# Patient Record
Sex: Male | Born: 1940 | Race: White | Hispanic: No | Marital: Married | Smoking: Never smoker
Health system: Southern US, Community
[De-identification: ages and names within clinical notes are randomized; demographics above are authoritative.]

## PROBLEM LIST (undated history)

## (undated) DIAGNOSIS — M4180 Other forms of scoliosis, site unspecified: Secondary | ICD-10-CM

## (undated) DIAGNOSIS — K7581 Nonalcoholic steatohepatitis (NASH): Secondary | ICD-10-CM

## (undated) DIAGNOSIS — E7800 Pure hypercholesterolemia, unspecified: Secondary | ICD-10-CM

## (undated) DIAGNOSIS — C61 Malignant neoplasm of prostate: Secondary | ICD-10-CM

## (undated) DIAGNOSIS — I4891 Unspecified atrial fibrillation: Secondary | ICD-10-CM

## (undated) HISTORY — PX: HERNIA REPAIR: SHX51

## (undated) HISTORY — PX: OTHER SURGICAL HISTORY: SHX169

## (undated) HISTORY — PX: PACEMAKER IMPLANT: EP1218

## (undated) HISTORY — PX: SHOULDER SURGERY: SHX246

## (undated) HISTORY — PX: KNEE SURGERY: SHX244

---

## 2014-02-12 ENCOUNTER — Encounter (HOSPITAL_BASED_OUTPATIENT_CLINIC_OR_DEPARTMENT_OTHER): Payer: Self-pay | Admitting: Emergency Medicine

## 2014-02-12 ENCOUNTER — Other Ambulatory Visit (HOSPITAL_BASED_OUTPATIENT_CLINIC_OR_DEPARTMENT_OTHER): Payer: Self-pay | Admitting: Family Medicine

## 2014-02-12 ENCOUNTER — Inpatient Hospital Stay (HOSPITAL_BASED_OUTPATIENT_CLINIC_OR_DEPARTMENT_OTHER)
Admission: EM | Admit: 2014-02-12 | Discharge: 2014-02-18 | DRG: 390 | Disposition: A | Discharge status: Home / Self Care | Payer: Medicare Other | Attending: Surgery | Admitting: Surgery

## 2014-02-12 ENCOUNTER — Ambulatory Visit (HOSPITAL_BASED_OUTPATIENT_CLINIC_OR_DEPARTMENT_OTHER)
Admission: RE | Admit: 2014-02-12 | Discharge: 2014-02-12 | Disposition: A | Discharge status: Home / Self Care | Payer: Medicare Other | Source: Ambulatory Visit | Attending: Family Medicine | Admitting: Family Medicine

## 2014-02-12 DIAGNOSIS — K565 Intestinal adhesions [bands], unspecified as to partial versus complete obstruction: Secondary | ICD-10-CM

## 2014-02-12 DIAGNOSIS — M4120 Other idiopathic scoliosis, site unspecified: Secondary | ICD-10-CM | POA: Diagnosis present

## 2014-02-12 DIAGNOSIS — K56609 Unspecified intestinal obstruction, unspecified as to partial versus complete obstruction: Principal | ICD-10-CM | POA: Diagnosis present

## 2014-02-12 DIAGNOSIS — E669 Obesity, unspecified: Secondary | ICD-10-CM | POA: Diagnosis present

## 2014-02-12 DIAGNOSIS — I482 Chronic atrial fibrillation, unspecified: Secondary | ICD-10-CM

## 2014-02-12 DIAGNOSIS — R1031 Right lower quadrant pain: Secondary | ICD-10-CM

## 2014-02-12 DIAGNOSIS — E785 Hyperlipidemia, unspecified: Secondary | ICD-10-CM | POA: Diagnosis present

## 2014-02-12 DIAGNOSIS — R630 Anorexia: Secondary | ICD-10-CM | POA: Diagnosis present

## 2014-02-12 DIAGNOSIS — Z6829 Body mass index (BMI) 29.0-29.9, adult: Secondary | ICD-10-CM

## 2014-02-12 DIAGNOSIS — I4891 Unspecified atrial fibrillation: Secondary | ICD-10-CM | POA: Diagnosis present

## 2014-02-12 DIAGNOSIS — K7689 Other specified diseases of liver: Secondary | ICD-10-CM | POA: Diagnosis present

## 2014-02-12 DIAGNOSIS — M4180 Other forms of scoliosis, site unspecified: Secondary | ICD-10-CM

## 2014-02-12 DIAGNOSIS — G4730 Sleep apnea, unspecified: Secondary | ICD-10-CM | POA: Diagnosis present

## 2014-02-12 DIAGNOSIS — K7581 Nonalcoholic steatohepatitis (NASH): Secondary | ICD-10-CM

## 2014-02-12 HISTORY — DX: Other forms of scoliosis, site unspecified: M41.80

## 2014-02-12 HISTORY — DX: Unspecified atrial fibrillation: I48.91

## 2014-02-12 HISTORY — DX: Nonalcoholic steatohepatitis (NASH): K75.81

## 2014-02-12 HISTORY — DX: Pure hypercholesterolemia, unspecified: E78.00

## 2014-02-12 LAB — URINALYSIS, ROUTINE W REFLEX MICROSCOPIC
Bilirubin Urine: NEGATIVE
GLUCOSE, UA: NEGATIVE mg/dL
Hgb urine dipstick: NEGATIVE
Ketones, ur: NEGATIVE mg/dL
Leukocytes, UA: NEGATIVE
Nitrite: NEGATIVE
Protein, ur: NEGATIVE mg/dL
SPECIFIC GRAVITY, URINE: 1.03 (ref 1.005–1.030)
UROBILINOGEN UA: 1 mg/dL (ref 0.0–1.0)
pH: 6.5 (ref 5.0–8.0)

## 2014-02-12 LAB — COMPREHENSIVE METABOLIC PANEL
ALBUMIN: 4.2 g/dL (ref 3.5–5.2)
ALT: 28 U/L (ref 0–53)
ANION GAP: 13 (ref 5–15)
AST: 35 U/L (ref 0–37)
Alkaline Phosphatase: 53 U/L (ref 39–117)
BILIRUBIN TOTAL: 2 mg/dL — AB (ref 0.3–1.2)
BUN: 19 mg/dL (ref 6–23)
CHLORIDE: 99 meq/L (ref 96–112)
CO2: 29 mEq/L (ref 19–32)
Calcium: 10.4 mg/dL (ref 8.4–10.5)
Creatinine, Ser: 0.9 mg/dL (ref 0.50–1.35)
GFR calc Af Amer: >90 mL/min (ref >90)
GFR calc non Af Amer: 84 mL/min — ABNORMAL LOW (ref >90)
Glucose, Bld: 109 mg/dL — ABNORMAL HIGH (ref 70–99)
POTASSIUM: 4.1 meq/L (ref 3.7–5.3)
Sodium: 141 mEq/L (ref 137–147)
TOTAL PROTEIN: 7.9 g/dL (ref 6.0–8.3)

## 2014-02-12 LAB — CBC WITH DIFFERENTIAL/PLATELET
BASOS ABS: 0 10*3/uL (ref 0.0–0.1)
Basophils Relative: 0 % (ref 0–1)
EOS ABS: 0.1 10*3/uL (ref 0.0–0.7)
Eosinophils Relative: 1 % (ref 0–5)
HCT: 47.4 % (ref 39.0–52.0)
HEMOGLOBIN: 16.1 g/dL (ref 13.0–17.0)
Lymphocytes Relative: 15 % (ref 12–46)
Lymphs Abs: 1.5 10*3/uL (ref 0.7–4.0)
MCH: 31.9 pg (ref 26.0–34.0)
MCHC: 34 g/dL (ref 30.0–36.0)
MCV: 94 fL (ref 78.0–100.0)
MONOS PCT: 11 % (ref 3–12)
Monocytes Absolute: 1.1 10*3/uL — ABNORMAL HIGH (ref 0.1–1.0)
NEUTROS ABS: 7.4 10*3/uL (ref 1.7–7.7)
NEUTROS PCT: 73 % (ref 43–77)
Platelets: 180 10*3/uL (ref 150–400)
RBC: 5.04 MIL/uL (ref 4.22–5.81)
RDW: 13.1 % (ref 11.5–15.5)
WBC: 10.1 10*3/uL (ref 4.0–10.5)

## 2014-02-12 LAB — LIPASE, BLOOD: Lipase: 16 U/L (ref 11–59)

## 2014-02-12 MED ORDER — PROMETHAZINE HCL 25 MG/ML IJ SOLN: 6.2500 mg | Freq: Four times a day (QID) | INTRAMUSCULAR | Status: DC | PRN

## 2014-02-12 MED ORDER — MAGIC MOUTHWASH
15.0000 mL | Freq: Four times a day (QID) | ORAL | Status: DC | PRN
  Administered 2014-02-13 – 2014-02-15 (×2): 15 mL via ORAL
  Filled 2014-02-12 (×2): qty 15

## 2014-02-12 MED ORDER — LIP MEDEX EX OINT
1.0000 "application " | TOPICAL_OINTMENT | Freq: Two times a day (BID) | CUTANEOUS | Status: DC
  Administered 2014-02-12 – 2014-02-17 (×9): 1 via TOPICAL
  Filled 2014-02-12: qty 7

## 2014-02-12 MED ORDER — ONDANSETRON HCL 4 MG/2ML IJ SOLN: 4.0000 mg | Freq: Four times a day (QID) | INTRAMUSCULAR | Status: DC | PRN

## 2014-02-12 MED ORDER — KCL IN DEXTROSE-NACL 40-5-0.45 MEQ/L-%-% IV SOLN
INTRAVENOUS | Status: DC
  Administered 2014-02-12: 23:00:00 via INTRAVENOUS
  Filled 2014-02-12 (×2): qty 1000

## 2014-02-12 MED ORDER — PHENOL 1.4 % MT LIQD: 2.0000 | OROMUCOSAL | Status: DC | PRN

## 2014-02-12 MED ORDER — ALUM & MAG HYDROXIDE-SIMETH 200-200-20 MG/5ML PO SUSP: 30.0000 mL | Freq: Four times a day (QID) | ORAL | Status: DC | PRN

## 2014-02-12 MED ORDER — ACETAMINOPHEN 650 MG RE SUPP
650.0000 mg | Freq: Four times a day (QID) | RECTAL | Status: DC | PRN
  Administered 2014-02-14: 650 mg via RECTAL
  Filled 2014-02-12: qty 1

## 2014-02-12 MED ORDER — LACTATED RINGERS IV BOLUS (SEPSIS): 1000.0000 mL | Freq: Three times a day (TID) | INTRAVENOUS | Status: DC | PRN

## 2014-02-12 MED ORDER — METHOCARBAMOL 1000 MG/10ML IJ SOLN: 500.0000 mg | Freq: Four times a day (QID) | INTRAVENOUS | Status: DC | PRN

## 2014-02-12 MED ORDER — ONDANSETRON HCL 4 MG/2ML IJ SOLN
4.0000 mg | Freq: Once | INTRAMUSCULAR | Status: AC
  Administered 2014-02-12: 4 mg via INTRAVENOUS
  Filled 2014-02-12: qty 2

## 2014-02-12 MED ORDER — ONDANSETRON 8 MG/NS 50 ML IVPB: 8.0000 mg | Freq: Four times a day (QID) | INTRAVENOUS | Status: DC | PRN

## 2014-02-12 MED ORDER — BISACODYL 10 MG RE SUPP
10.0000 mg | Freq: Every day | RECTAL | Status: DC
  Administered 2014-02-13 – 2014-02-17 (×5): 10 mg via RECTAL
  Filled 2014-02-12 (×5): qty 1

## 2014-02-12 MED ORDER — ONDANSETRON HCL 4 MG/2ML IJ SOLN
4.0000 mg | Freq: Once | INTRAMUSCULAR | Status: AC
  Administered 2014-02-12: 4 mg via INTRAVENOUS

## 2014-02-12 MED ORDER — HYDROMORPHONE HCL PF 1 MG/ML IJ SOLN
1.0000 mg | Freq: Once | INTRAMUSCULAR | Status: AC
  Administered 2014-02-12: 1 mg via INTRAVENOUS
  Filled 2014-02-12: qty 1

## 2014-02-12 MED ORDER — HYDROMORPHONE HCL PF 1 MG/ML IJ SOLN
0.5000 mg | INTRAMUSCULAR | Status: DC | PRN
  Administered 2014-02-12: 1 mg via INTRAVENOUS
  Administered 2014-02-12: 0.5 mg via INTRAVENOUS
  Filled 2014-02-12 (×2): qty 1

## 2014-02-12 MED ORDER — HEPARIN SODIUM (PORCINE) 5000 UNIT/ML IJ SOLN
5000.0000 [IU] | Freq: Three times a day (TID) | INTRAMUSCULAR | Status: DC
  Administered 2014-02-12 – 2014-02-18 (×17): 5000 [IU] via SUBCUTANEOUS
  Filled 2014-02-12 (×20): qty 1

## 2014-02-12 MED ORDER — METOPROLOL TARTRATE 1 MG/ML IV SOLN
5.0000 mg | Freq: Four times a day (QID) | INTRAVENOUS | Status: DC
  Administered 2014-02-12 – 2014-02-17 (×14): 5 mg via INTRAVENOUS
  Filled 2014-02-12 (×22): qty 5

## 2014-02-12 MED ORDER — DIPHENHYDRAMINE HCL 50 MG/ML IJ SOLN
12.5000 mg | Freq: Four times a day (QID) | INTRAMUSCULAR | Status: DC | PRN
  Administered 2014-02-18: 25 mg via INTRAVENOUS
  Filled 2014-02-12: qty 1

## 2014-02-12 MED ORDER — MENTHOL 3 MG MT LOZG
1.0000 | LOZENGE | OROMUCOSAL | Status: DC | PRN
  Administered 2014-02-12 – 2014-02-17 (×2): 3 mg via ORAL
  Filled 2014-02-12 (×2): qty 9

## 2014-02-12 MED ORDER — LACTATED RINGERS IV BOLUS (SEPSIS)
1000.0000 mL | Freq: Once | INTRAVENOUS | Status: AC
  Administered 2014-02-12: 1000 mL via INTRAVENOUS

## 2014-02-12 MED ORDER — METOPROLOL TARTRATE 1 MG/ML IV SOLN
5.0000 mg | Freq: Four times a day (QID) | INTRAVENOUS | Status: DC | PRN
  Filled 2014-02-12: qty 5

## 2014-02-12 MED ORDER — ONDANSETRON HCL 4 MG/2ML IJ SOLN
INTRAMUSCULAR | Status: AC
  Administered 2014-02-12: 4 mg via INTRAVENOUS
  Filled 2014-02-12: qty 2

## 2014-02-12 MED ORDER — SODIUM CHLORIDE 0.9 % IV SOLN
1000.0000 mL | INTRAVENOUS | Status: DC
  Administered 2014-02-12: 1000 mL via INTRAVENOUS

## 2014-02-12 NOTE — ED Notes (Signed)
[facility]

## 2014-02-12 NOTE — H&P (Signed)
[facility] [place]  [address] [place], [place] [address] Phone: [phone] FAX: [phone]     Sayf Kerner  11-08-1940 [id number]  CARE TEAM:  PCP: Guadlupe Spanish, MD  Outpatient Care Team: Patient Care Team: Guadlupe Spanish, MD as PCP - General (Internal Medicine) Raelene Bott, MD as Consulting Physician (Otolaryngology) Verdia Kuba, NP as Nurse Practitioner (Internal Medicine)  Inpatient Treatment Team: Treatment Team: Attending Provider: Babette Relic, MD; Consulting Physician: Nolon Nations, MD; Physician Assistant: Larene Pickett, PA-C; Registered Nurse: Ashok Pall, RN; Registered Nurse: Dahlia Bailiff, RN  This patient is a 73 y.o.male who presents today for surgical evaluation at the request of Carmin Muskrat, [facility] ED MD.   Reason for evaluation: Small Bowel Obstruction  Elderly male with history of atrial fibrillation and prior abdominal surgery.  Still works part-time.  Rather active.  It comes today with his wife.  Emergency room nurse in room.  Began to have lower abdominal pain & then mainly right lower quadrant starting yesterday.  Had some watery bowel movements yesterday but nothing today.  Increased belching.  No flatus.  Increasing nausea and pain.  Anorexia.  Went to Urgent care Center.  He tends to have MDs in [place] and [place].  Had prior inguinal hernia repairs decades ago.  Had an umbilical hernia repair done a year or 2 ago.  CT scan suspicious for bowel obstruction.  Sent to [place] emergency room.  Surgical consultation requested.  Patient transferred to [facility] so that surgery could see.  Patient claims in walk 20 minutes without difficulty.  Had an episode of atrial fibrillation that was controlled by cardiology a few years ago in [place].  Stable on metoprolol.  No anticoagulation aside from 81 mg aspirin.  Recalls normal colonoscopy 5 years ago.   No personal nor family history of GI/colon cancer, inflammatory bowel disease, irritable bowel syndrome, allergy such as Celiac Sprue, dietary/dairy problems, colitis, ulcers nor gastritis.  No recent sick contacts/gastroenteritis.  No travel outside the country.  No changes in diet.  No dysphagia to solids or liquids.  No significant heartburn or reflux.  No hematochezia, hematemesis, coffee ground emesis.  No evidence of prior gastric/peptic ulceration.    Past Medical History  Diagnosis Date  . Atrial fibrillation   . High cholesterol   . Dextroscoliosis - lumbar 02/12/2014    Past Surgical History  Procedure Laterality Date  . Hernia repair    . Knee surgery Bilateral   . Shoulder surgery Left   . Carpel tunnel Bilateral     History   Social History  . Marital Status: Married    Spouse Name: N/A    Number of Children: N/A  . Years of Education: N/A   Occupational History  . Not on file.   Social History Main Topics  . Smoking status: Never Smoker   . Smokeless tobacco: Not on file  . Alcohol Use: No  . Drug Use: No  . Sexual Activity: Not on file   Other Topics Concern  . Not on file   Social History Narrative  . No narrative on file    History reviewed. No pertinent family history.  Current Facility-Administered Medications  Medication Dose Route Frequency Provider Last Rate Last Dose  . 0.9 %  sodium chloride infusion  1,000 mL Intravenous Continuous Carmin Muskrat, MD   1,000 mL at 02/12/14 1614   Current  Outpatient Prescriptions  Medication Sig Dispense Refill  . aspirin 81 MG chewable tablet Chew 81 mg by mouth daily.      Marland Kitchen atorvastatin (LIPITOR) 10 MG tablet Take 10 mg by mouth daily.      . metoprolol tartrate (LOPRESSOR) 25 MG tablet Take 25 mg by mouth daily.      . TURMERIC CURCUMIN PO Take by mouth.      . Zinc Sulfate (ZINC 15 PO) Take by mouth.         No Known Allergies  ROS: Constitutional:  No fevers, chills, sweats.  Weight  stable Eyes:  No vision changes, No discharge HENT:  No sore throats, nasal drainage Lymph: No neck swelling, No bruising easily Pulmonary:  No cough, productive sputum CV: No orthopnea, PND  Patient walks 30 minutes for about 1 miles without difficulty.  No exertional chest/neck/shoulder/arm pain. GI: No personal nor family history of GI/colon cancer, inflammatory bowel disease, irritable bowel syndrome, allergy such as Celiac Sprue, dietary/dairy problems, colitis, ulcers nor gastritis.  No recent sick contacts/gastroenteritis.  No travel outside the country.  No changes in diet. Renal: No UTIs, No hematuria Genital:  No drainage, bleeding, masses Musculoskeletal: No severe joint pain.  Good ROM major joints Skin:  No sores or lesions.  No rashes Heme/Lymph:  No easy bleeding.  No swollen lymph nodes Neuro: No focal weakness/numbness.  No seizures Psych: No suicidal ideation.  No hallucinations  BP 129/69  Pulse 73  Temp(Src) 98.9 F (37.2 C) (Oral)  Resp 18  SpO2 97%  Physical Exam: General: Pt awake/alert/oriented x4 in no major acute distress Eyes: PERRL, normal EOM. Sclera nonicteric Neuro: CN II-XII intact w/o focal sensory/motor deficits. Lymph: No head/neck/groin lymphadenopathy Psych:  No delerium/psychosis/paranoia HENT: Normocephalic, Mucus membranes dry.  No thrush.  Nasogastric tube and ring narrow.  Narrow small nasogastric tube.  Creamy tan return.  300 mL in canister. Neck: Supple, No tracheal deviation Chest: No pain.  Good respiratory excursion. CV:  Pulses intact.  Regular rhythm Abdomen: Soft, Mod distended.  Mildly tender periumbilical > RLQ.  No incarcerated hernias.  No guarding. Genital:  Normal external male genitalia.  No inguinal hernias. Ext:  SCDs BLE.  No significant edema.  No cyanosis Skin: No petechiae / purpurea.  No major sores Musculoskeletal: No severe joint pain.  Good ROM major joints   Results:   Labs: Results for orders placed during  the hospital encounter of 02/12/14 (from the past 48 hour(s))  CBC WITH DIFFERENTIAL     Status: Abnormal   Collection Time    02/12/14  4:10 PM      Result Value Ref Range   WBC 10.1  4.0 - 10.5 K/uL   RBC 5.04  4.22 - 5.81 MIL/uL   Hemoglobin 16.1  13.0 - 17.0 g/dL   HCT 47.4  39.0 - 52.0 %   MCV 94.0  78.0 - 100.0 fL   MCH 31.9  26.0 - 34.0 pg   MCHC 34.0  30.0 - 36.0 g/dL   RDW 13.1  11.5 - 15.5 %   Platelets 180  150 - 400 K/uL   Neutrophils Relative % 73  43 - 77 %   Neutro Abs 7.4  1.7 - 7.7 K/uL   Lymphocytes Relative 15  12 - 46 %   Lymphs Abs 1.5  0.7 - 4.0 K/uL   Monocytes Relative 11  3 - 12 %   Monocytes Absolute 1.1 (*) 0.1 - 1.0 K/uL  Eosinophils Relative 1  0 - 5 %   Eosinophils Absolute 0.1  0.0 - 0.7 K/uL   Basophils Relative 0  0 - 1 %   Basophils Absolute 0.0  0.0 - 0.1 K/uL  COMPREHENSIVE METABOLIC PANEL     Status: Abnormal   Collection Time    02/12/14  4:10 PM      Result Value Ref Range   Sodium 141  137 - 147 mEq/L   Potassium 4.1  3.7 - 5.3 mEq/L   Chloride 99  96 - 112 mEq/L   CO2 29  19 - 32 mEq/L   Glucose, Bld 109 (*) 70 - 99 mg/dL   BUN 19  6 - 23 mg/dL   Creatinine, Ser 0.90  0.50 - 1.35 mg/dL   Calcium 10.4  8.4 - 10.5 mg/dL   Total Protein 7.9  6.0 - 8.3 g/dL   Albumin 4.2  3.5 - 5.2 g/dL   AST 35  0 - 37 U/L   ALT 28  0 - 53 U/L   Alkaline Phosphatase 53  39 - 117 U/L   Total Bilirubin 2.0 (*) 0.3 - 1.2 mg/dL   GFR calc non Af Amer 84 (*) >90 mL/min   GFR calc Af Amer >90  >90 mL/min   Comment: (NOTE)     The eGFR has been calculated using the CKD EPI equation.     This calculation has not been validated in all clinical situations.     eGFR's persistently <90 mL/min signify possible Chronic Kidney     Disease.   Anion gap 13  5 - 15  LIPASE, BLOOD     Status: None   Collection Time    02/12/14  4:10 PM      Result Value Ref Range   Lipase 16  11 - 59 U/L  URINALYSIS, ROUTINE W REFLEX MICROSCOPIC     Status: None   Collection  Time    02/12/14  4:26 PM      Result Value Ref Range   Color, Urine YELLOW  YELLOW   APPearance CLEAR  CLEAR   Specific Gravity, Urine 1.030  1.005 - 1.030   pH 6.5  5.0 - 8.0   Glucose, UA NEGATIVE  NEGATIVE mg/dL   Hgb urine dipstick NEGATIVE  NEGATIVE   Bilirubin Urine NEGATIVE  NEGATIVE   Ketones, ur NEGATIVE  NEGATIVE mg/dL   Protein, ur NEGATIVE  NEGATIVE mg/dL   Urobilinogen, UA 1.0  0.0 - 1.0 mg/dL   Nitrite NEGATIVE  NEGATIVE   Leukocytes, UA NEGATIVE  NEGATIVE   Comment: MICROSCOPIC NOT DONE ON URINES WITH NEGATIVE PROTEIN, BLOOD, LEUKOCYTES, NITRITE, OR GLUCOSE <1000 mg/dL.    Imaging / Studies: Ct Abdomen Pelvis Wo Contrast  02/12/2014   CLINICAL DATA:  Abdominal pain and decreased appetite  EXAM: CT ABDOMEN AND PELVIS WITHOUT CONTRAST  TECHNIQUE: Multidetector CT imaging of the abdomen and pelvis was performed following the standard protocol without IV contrast. Oral contrast was administered.  COMPARISON:  October 06, 2005  FINDINGS: There is atelectatic change in the right lower lobe. There is a nodular opacity in the medial segment of the right lower lobe measuring 1.6 x 1.5 cm. This opacity was present although incompletely visualized on the previous study.  Liver is prominent, measuring 17.7 cm in length. There is fatty change in the liver. No focal liver lesions are identified on this noncontrast enhanced study. Gallbladder wall is not thickened. There is no biliary duct dilatation.  Spleen, pancreas, and adrenals appear normal.  There is a parapelvic cyst on the right measuring 2.5 x 1.3 cm. There is a 1.8 x 1.8 cm cyst in the anterior mid left kidney. There is a parapelvic cyst on the left measuring 2.5 x 1.8 cm. There is no appreciable hydronephrosis on either side. There is no renal or ureteral calculus on either side. There is a retroaortic left renal vein, an anatomic variant.  In the pelvis, the prostate is diffusely enlarged with multiple calcifications within the  prostate. There is mild urinary bladder wall thickening. No pelvic mass or fluid collection is appreciable. Appendix appears normal.  There is bowel obstruction with a transition zone near the jejunoileal junction. No free air or portal venous air.  There is no ascites, adenopathy, or abscess in the abdomen or pelvis. There is atherosclerotic change in the aorta but no aneurysm. There is degenerative change in the lumbar spine. There are no blastic or lytic bone lesions. There is lumbar dextroscoliosis.  IMPRESSION: Evidence of a degree of bowel obstruction with transition zone near the jejunoileal junction. No free air. No abscess. Appendix appears normal.  Prominent liver with fatty change.  Nodular area in the right lower lobe, felt to be essentially stable compared to prior study from 2007.  Enlarged prostate. Question cystitis given urinary bladder wall thickening.  These results were called by telephone at the time of interpretation on 02/12/2014 at 3:16 pm to Dr. Threasa Alpha , who verbally acknowledged these results.   Electronically Signed   By: Bretta Bang M.D.   On: 02/12/2014 15:16    Medications / Allergies: per chart  Antibiotics: Anti-infectives   None      Assessment  Prestyn Stanco  73 y.o. male       Problem List:  Principal Problem:   SBO (small bowel obstruction) Active Problems:   Steatohepatitis, nonalcoholic   Obesity (BMI 30-39.9)   Small bowel obstruction most likely from adhesions.  Seems to be in proximal/mid jejunum.  An optically distal.  Nontoxic.  Plan:  Admit  IV fluids.  Nasogastric tube decompression.  Nasogastric tube adjusted and flushed and appropriate filter placed.  Working better now.  If it clots, upsize it to an appropriate caliber.  Despite narrow caliber, seems to be working okay.  Follow.  X-ray films the morning.  Hopefully will resolve nonoperatively.  Pathophysiology of bowel obstruction with the most likely etiology of  adhesions from prior surgery discussed.  No evidence of hernia or malignancy.  Normal colonoscopy in past 10 years argues against colonic etiology.  The patient is stable.  There is no evidence of peritonitis, acute abdomen, nor shock.  There is no strong evidence of failure of improvement nor decline with current non-operative management.  There is no need for surgery at the present moment.   We will continue to follow.   -VTE prophylaxis- SCDs, etc  Hypertension.  IV metoprolol for prevention of paroxysmal atrial fibrillation patient is n.p.o.  Hypercholesterolemia.  Hold medications for now   Mobilize as tolerated to help recovery    Ardeth Sportsman, M.D., F.A.C.S. Gastrointestinal and Minimally Invasive Surgery [facility] [address] [place], [place] [address] [phone] Main / Paging   02/12/2014  Note: Portions of this report may have been transcribed using voice recognition software. Every effort was made to ensure accuracy; however, inadvertent computerized transcription errors may be present.   Any transcriptional errors that result from this process  are unintentional.

## 2014-02-12 NOTE — ED Notes (Signed)
Patient was sweating and nauseous after pain medication. Given 4 mg additional zofran and nausea relieved

## 2014-02-12 NOTE — ED Notes (Signed)
Bed: WA08 Expected date:  Expected time:  Means of arrival:  Comments:  [place]

## 2014-02-12 NOTE — ED Provider Notes (Signed)
  Physical Exam  BP 129/69  Pulse 73  Temp(Src) 98.9 F (37.2 C) (Oral)  Resp 18  SpO2 97%  Physical Exam  Nursing note and vitals reviewed. Constitutional: He is oriented to person, place, and time. He appears well-developed and well-nourished. No distress.  HENT:  Head: Normocephalic and atraumatic.  Mouth/Throat: Oropharynx is clear and moist.  NG tube in place in right nare  Eyes: Conjunctivae and EOM are normal. Pupils are equal, round, and reactive to light.  Neck: Normal range of motion. Neck supple.  Cardiovascular: Normal rate, regular rhythm and normal heart sounds.   Pulmonary/Chest: Effort normal and breath sounds normal. No respiratory distress. He has no wheezes.  Abdominal: Soft. Bowel sounds are normal. He exhibits distension. There is no tenderness. There is no rebound and no guarding.  Abdomen appear distended  Musculoskeletal: Normal range of motion.  Neurological: He is alert and oriented to person, place, and time.  Skin: Skin is warm and dry. He is not diaphoretic.  Psychiatric: He has a normal mood and affect.    ED Course  Procedures  MDM Patient received in [facility] after evaluation with CT scan revealed small bowel obstruction. On my evaluation, patient states he is currently pain-free without any nausea. He has an NG tube in place. I have notified general surgery of his arrival to the ED and they will come down to evaluate and admit.  Diagnosis 1.  Small Bowel Obstruction  Garlon HatchetLisa M Isabellarose Kope, PA-C 02/12/14 2056

## 2014-02-12 NOTE — ED Notes (Signed)
Patient states that he started experiencing R lower abd pain yesterday afternoon that eased off and started hurting mid abd afterwards. Went to urgent care and that ordered CT of abd that reported bowel obstruction

## 2014-02-13 ENCOUNTER — Inpatient Hospital Stay (HOSPITAL_COMMUNITY): Payer: Medicare Other

## 2014-02-13 ENCOUNTER — Encounter (HOSPITAL_COMMUNITY): Payer: Self-pay | Admitting: General Practice

## 2014-02-13 LAB — CBC
HCT: 42.9 % (ref 39.0–52.0)
Hemoglobin: 14.4 g/dL (ref 13.0–17.0)
MCH: 31.6 pg (ref 26.0–34.0)
MCHC: 33.6 g/dL (ref 30.0–36.0)
MCV: 94.1 fL (ref 78.0–100.0)
PLATELETS: 161 10*3/uL (ref 150–400)
RBC: 4.56 MIL/uL (ref 4.22–5.81)
RDW: 13 % (ref 11.5–15.5)
WBC: 9.3 10*3/uL (ref 4.0–10.5)

## 2014-02-13 LAB — BASIC METABOLIC PANEL
Anion gap: 12 (ref 5–15)
BUN: 15 mg/dL (ref 6–23)
CALCIUM: 8.6 mg/dL (ref 8.4–10.5)
CO2: 26 meq/L (ref 19–32)
Chloride: 102 mEq/L (ref 96–112)
Creatinine, Ser: 0.77 mg/dL (ref 0.50–1.35)
GFR calc Af Amer: >90 mL/min (ref >90)
GFR, EST NON AFRICAN AMERICAN: 89 mL/min — AB (ref >90)
Glucose, Bld: 138 mg/dL — ABNORMAL HIGH (ref 70–99)
POTASSIUM: 3.9 meq/L (ref 3.7–5.3)
SODIUM: 140 meq/L (ref 137–147)

## 2014-02-13 MED ORDER — CETYLPYRIDINIUM CHLORIDE 0.05 % MT LIQD
7.0000 mL | Freq: Two times a day (BID) | OROMUCOSAL | Status: DC
  Administered 2014-02-13 – 2014-02-14 (×3): 7 mL via OROMUCOSAL

## 2014-02-13 MED ORDER — KCL IN DEXTROSE-NACL 30-5-0.45 MEQ/L-%-% IV SOLN
INTRAVENOUS | Status: DC
  Administered 2014-02-13 – 2014-02-18 (×10): via INTRAVENOUS
  Filled 2014-02-13 (×13): qty 1000

## 2014-02-13 MED ORDER — CHLORHEXIDINE GLUCONATE 0.12 % MT SOLN
15.0000 mL | Freq: Two times a day (BID) | OROMUCOSAL | Status: DC
  Administered 2014-02-13 – 2014-02-15 (×6): 15 mL via OROMUCOSAL
  Filled 2014-02-13 (×8): qty 15

## 2014-02-13 NOTE — Progress Notes (Signed)
Patient ID: Clayton Daniels, male   DOB: 08/12/1940, 73 y.o.   MRN: [id number]  General Surgery - [facility] - Progress Note  HD# 2  Subjective: Patient with mild pain in mid abdomen.  Passed small flatus, no BM.  No nausea, emesis.  NG in place.  Objective: Vital signs in last 24 hours: Temp:  [97.5 F (36.4 C)-99.7 F (37.6 C)] 99.7 F (37.6 C) (08/23 0536) Pulse Rate:  [64-85] 84 (08/23 0536) Resp:  [16-18] 17 (08/23 0536) BP: (92-157)/(51-88) 92/51 mmHg (08/23 0536) SpO2:  [90 %-97 %] 92 % (08/23 0536) Weight:  [220 lb (99.791 kg)] 220 lb (99.791 kg) (08/22 2238)    Intake/Output from previous day: 08/22 0701 - 08/23 0700 In: 1948.3 [I.V.:948.3; IV Piggyback:1000] Out: 445 [Urine:225; Emesis/NG output:220]  Exam: HEENT - clear, not icteric Neck - soft Chest - clear bilaterally Cor - RRR, no murmur Abd - soft without distension; BS present; mild periumbilical tenderness Ext - no significant edema Neuro - grossly intact, no focal deficits  Lab Results:   Recent Labs  02/12/14 1610 02/13/14 0604  WBC 10.1 9.3  HGB 16.1 14.4  HCT 47.4 42.9  PLT 180 161     Recent Labs  02/12/14 1610 02/13/14 0604  NA 141 140  K 4.1 3.9  CL 99 102  CO2 29 26  GLUCOSE 109* 138*  BUN 19 15  CREATININE 0.90 0.77  CALCIUM 10.4 8.6    Studies/Results: Ct Abdomen Pelvis Wo Contrast  02/12/2014   CLINICAL DATA:  Abdominal pain and decreased appetite  EXAM: CT ABDOMEN AND PELVIS WITHOUT CONTRAST  TECHNIQUE: Multidetector CT imaging of the abdomen and pelvis was performed following the standard protocol without IV contrast. Oral contrast was administered.  COMPARISON:  October 06, 2005  FINDINGS: There is atelectatic change in the right lower lobe. There is a nodular opacity in the medial segment of the right lower lobe measuring 1.6 x 1.5 cm. This opacity was present although incompletely visualized on the previous study.  Liver is prominent, measuring 17.7 cm in  length. There is fatty change in the liver. No focal liver lesions are identified on this noncontrast enhanced study. Gallbladder wall is not thickened. There is no biliary duct dilatation.  Spleen, pancreas, and adrenals appear normal.  There is a parapelvic cyst on the right measuring 2.5 x 1.3 cm. There is a 1.8 x 1.8 cm cyst in the anterior mid left kidney. There is a parapelvic cyst on the left measuring 2.5 x 1.8 cm. There is no appreciable hydronephrosis on either side. There is no renal or ureteral calculus on either side. There is a retroaortic left renal vein, an anatomic variant.  In the pelvis, the prostate is diffusely enlarged with multiple calcifications within the prostate. There is mild urinary bladder wall thickening. No pelvic mass or fluid collection is appreciable. Appendix appears normal.  There is bowel obstruction with a transition zone near the jejunoileal junction. No free air or portal venous air.  There is no ascites, adenopathy, or abscess in the abdomen or pelvis. There is atherosclerotic change in the aorta but no aneurysm. There is degenerative change in the lumbar spine. There are no blastic or lytic bone lesions. There is lumbar dextroscoliosis.  IMPRESSION: Evidence of a degree of bowel obstruction with transition zone near the jejunoileal junction. No free air. No abscess. Appendix appears normal.  Prominent liver with fatty change.  Nodular area in the right lower lobe, felt to be  essentially stable compared to prior study from 2007.  Enlarged prostate. Question cystitis given urinary bladder wall thickening.  These results were called by telephone at the time of interpretation on 02/12/2014 at 3:16 pm to Dr. Threasa Alpha , who verbally acknowledged these results.   Electronically Signed   By: Bretta Bang M.D.   On: 02/12/2014 15:16    Assessment / Plan: 1.  Partial small bowel obstruction  AXR reviewed and contrast now in colon  Clinically improving, will leave NG in  place today  IV hydration, NPO except for ice chips  Ambulate in halls  Will follow  Velora Heckler, MD, [facility] Office: [phone]  02/13/2014

## 2014-02-14 DIAGNOSIS — R509 Fever, unspecified: Secondary | ICD-10-CM

## 2014-02-14 NOTE — Progress Notes (Signed)
General surgery attending:  I have interviewed and examined this patient this morning. I agree with the assessment and treatment plan outlined by Mr. Marlyne Beards, [place].  The patient is passing flatus and feels a good deal better. His coughing a little bit but not productive. A low-grade temp, 100.8, not sure why this is.  Abdominal x-rays yesterday showed that contrast admitted through the colon, but there were still some dilated small bowel loops.  On exam he looks good. No distress. Alert. Abdomen is soft but a little bit tender on the right side. Infraumbilical incision well healed no hernia. Groin incision is well healed from prior inguinal hernia surgery, no mass or recurrence.  Assessment/plan: Partial small bowel obstruction. Presumed secondary to adhesions. No sign of recurrent hernia on exam.   Passing flatus and x-ray yesterday looked better. I agree with towel of NG tube clamping Repeat labs and x-rays tomorrow  Low-grade fever. Etiology and significance unclear.  Check urinalysis. Recheck WBC with differential tomorrow.  Hypertension. On IV beta blocker for nail due to paroxysmal atrial fibrillation.  Hyperlipidemia. Restart medications as soon as diet resumed.   Angelia Mould. Derrell Lolling, M.D., [facility] General and Minimally invasive Surgery Breast and Colorectal Surgery Office:   [phone] Pager:   [phone]   VTE prophylaxis. On subcutaneous heparin.

## 2014-02-14 NOTE — Progress Notes (Signed)
Subjective: He feels better, some flatus, he walked some yesterday.  He says it feels like he still bloated with coughing.  He has had 3 hernia surgeries in the past.  Objective: Vital signs in last 24 hours: Temp:  [98.2 F (36.8 C)-100.8 F (38.2 C)] 99.2 F (37.3 C) (08/24 0500) Pulse Rate:  [69-91] 89 (08/24 0500) Resp:  [14-18] 14 (08/24 0500) BP: (107-117)/(58-68) 117/63 mmHg (08/24 0500) SpO2:  [92 %-95 %] 92 % (08/24 0500) Last BM Date: 02/13/14 (loose) NG 250 recorded. Ice chips TM 100.8 early this AM 0200 No labs this AM NO film this AM  Contrast in colon and dilated SB on film yesterday.  Intake/Output from previous day: 08/23 0701 - 08/24 0700 In: 2423.3 [I.V.:2423.3] Out: 2225 [Urine:1975; Emesis/NG output:250] Intake/Output this shift:    General appearance: alert, cooperative and no distress Resp: clear to auscultation bilaterally GI: BS hyperactive, he is not really distended.  A little flatus, no BM.    Lab Results:   Recent Labs  02/12/14 1610 02/13/14 0604  WBC 10.1 9.3  HGB 16.1 14.4  HCT 47.4 42.9  PLT 180 161    BMET  Recent Labs  02/12/14 1610 02/13/14 0604  NA 141 140  K 4.1 3.9  CL 99 102  CO2 29 26  GLUCOSE 109* 138*  BUN 19 15  CREATININE 0.90 0.77  CALCIUM 10.4 8.6   PT/INR No results found for this basename: LABPROT, INR,  in the last 72 hours   Recent Labs Lab 02/12/14 1610  AST 35  ALT 28  ALKPHOS 53  BILITOT 2.0*  PROT 7.9  ALBUMIN 4.2     Lipase     Component Value Date/Time   LIPASE 16 02/12/2014 1610     Studies/Results: Ct Abdomen Pelvis Wo Contrast  02/12/2014   CLINICAL DATA:  Abdominal pain and decreased appetite  EXAM: CT ABDOMEN AND PELVIS WITHOUT CONTRAST  TECHNIQUE: Multidetector CT imaging of the abdomen and pelvis was performed following the standard protocol without IV contrast. Oral contrast was administered.  COMPARISON:  October 06, 2005  FINDINGS: There is atelectatic change in the  right lower lobe. There is a nodular opacity in the medial segment of the right lower lobe measuring 1.6 x 1.5 cm. This opacity was present although incompletely visualized on the previous study.  Liver is prominent, measuring 17.7 cm in length. There is fatty change in the liver. No focal liver lesions are identified on this noncontrast enhanced study. Gallbladder wall is not thickened. There is no biliary duct dilatation.  Spleen, pancreas, and adrenals appear normal.  There is a parapelvic cyst on the right measuring 2.5 x 1.3 cm. There is a 1.8 x 1.8 cm cyst in the anterior mid left kidney. There is a parapelvic cyst on the left measuring 2.5 x 1.8 cm. There is no appreciable hydronephrosis on either side. There is no renal or ureteral calculus on either side. There is a retroaortic left renal vein, an anatomic variant.  In the pelvis, the prostate is diffusely enlarged with multiple calcifications within the prostate. There is mild urinary bladder wall thickening. No pelvic mass or fluid collection is appreciable. Appendix appears normal.  There is bowel obstruction with a transition zone near the jejunoileal junction. No free air or portal venous air.  There is no ascites, adenopathy, or abscess in the abdomen or pelvis. There is atherosclerotic change in the aorta but no aneurysm. There is degenerative change in the lumbar spine. There  are no blastic or lytic bone lesions. There is lumbar dextroscoliosis.  IMPRESSION: Evidence of a degree of bowel obstruction with transition zone near the jejunoileal junction. No free air. No abscess. Appendix appears normal.  Prominent liver with fatty change.  Nodular area in the right lower lobe, felt to be essentially stable compared to prior study from 2007.  Enlarged prostate. Question cystitis given urinary bladder wall thickening.  These results were called by telephone at the time of interpretation on 02/12/2014 at 3:16 pm to Dr. Threasa Alpha , who verbally  acknowledged these results.   Electronically Signed   By: Bretta Bang M.D.   On: 02/12/2014 15:16   Dg Abd Acute W/chest  02/13/2014   CLINICAL DATA:  Abdominal pain.  EXAM: ACUTE ABDOMEN SERIES (ABDOMEN 2 VIEW & CHEST 1 VIEW)  COMPARISON:  CT scan of February 12, 2014.  FINDINGS: Nasogastric tube tip is seen in proximal stomach. Residual contrast is noted throughout the colon. Multilevel degenerative disc disease is noted in the lumbar spine. Dilated small bowel loops are noted in the central portion the abdomen concerning for distal small bowel obstruction. Heart size and mediastinal contours are within normal limits. Both lungs are clear.  IMPRESSION: Dilated small bowel loops are noted concerning for distal small bowel partial obstruction. Residual contrast is noted throughout the colon.   Electronically Signed   By: Roque Lias M.D.   On: 02/13/2014 08:23    Medications: . antiseptic oral rinse  7 mL Mouth Rinse q12n4p  . bisacodyl  10 mg Rectal Daily  . chlorhexidine  15 mL Mouth Rinse BID  . heparin  5,000 Units Subcutaneous 3 times per day  . lip balm  1 application Topical BID  . metoprolol  5 mg Intravenous 4 times per day   Scheduled Meds: . antiseptic oral rinse  7 mL Mouth Rinse q12n4p  . bisacodyl  10 mg Rectal Daily  . chlorhexidine  15 mL Mouth Rinse BID  . heparin  5,000 Units Subcutaneous 3 times per day  . lip balm  1 application Topical BID  . metoprolol  5 mg Intravenous 4 times per day   Continuous Infusions: . dexrose 5 % and 0.45 % NaCl with KCl 30 mEq/L 100 mL/hr at 02/14/14 0257   Prior to Admission medications   Medication Sig Start Date End Date Taking? Authorizing Provider  aspirin 81 MG chewable tablet Chew 81 mg by mouth daily.   Yes Historical Provider, MD  atorvastatin (LIPITOR) 10 MG tablet Take 10 mg by mouth daily.   Yes Historical Provider, MD  metoprolol succinate (TOPROL-XL) 25 MG 24 hr tablet Take 25 mg by mouth daily.   Yes Historical  Provider, MD  Multiple Vitamin (MULTIVITAMIN WITH MINERALS) TABS tablet Take 1 tablet by mouth daily. Men's 50 plus   Yes Historical Provider, MD  naproxen sodium (ANAPROX) 220 MG tablet Take 220 mg by mouth 2 (two) times daily as needed (for pain.).   Yes Historical Provider, MD  TURMERIC CURCUMIN PO Take 1 capsule by mouth 2 (two) times daily.    Yes Historical Provider, MD  white petrolatum (VASELINE) GEL Apply 1 application topically daily. Applied to area of head once daily   Yes Historical Provider, MD  Zinc 50 MG CAPS Take 50 mg by mouth daily.   Yes Historical Provider, MD    Assessment/Plan SBO with hx of umbilical hernia repair. Atrial fibrillation Dextro scoliosis Dyslipidemia  Body mass index is 29.83 kg/(m^2).  PLan:  I am going to try clamping his NG there is 600 ml in the cannister.  He may be better, but he doesn't look like he is  Ready to pull NG and go to clears.  Recheck film and labs in AM.  IS today.  LOS: 2 days    Clayton Daniels 02/14/2014

## 2014-02-15 ENCOUNTER — Inpatient Hospital Stay (HOSPITAL_COMMUNITY): Payer: Medicare Other

## 2014-02-15 LAB — BASIC METABOLIC PANEL
ANION GAP: 12 (ref 5–15)
Anion gap: 13 (ref 5–15)
BUN: 8 mg/dL (ref 6–23)
BUN: 8 mg/dL (ref 6–23)
CO2: 25 meq/L (ref 19–32)
CO2: 26 meq/L (ref 19–32)
CREATININE: 0.83 mg/dL (ref 0.50–1.35)
Calcium: 8.4 mg/dL (ref 8.4–10.5)
Calcium: 8.9 mg/dL (ref 8.4–10.5)
Chloride: 102 mEq/L (ref 96–112)
Chloride: 103 mEq/L (ref 96–112)
Creatinine, Ser: 0.85 mg/dL (ref 0.50–1.35)
GFR calc Af Amer: >90 mL/min (ref >90)
GFR calc Af Amer: >90 mL/min (ref >90)
GFR calc non Af Amer: 86 mL/min — ABNORMAL LOW (ref >90)
GFR calc non Af Amer: 86 mL/min — ABNORMAL LOW (ref >90)
GLUCOSE: 119 mg/dL — AB (ref 70–99)
Glucose, Bld: 124 mg/dL — ABNORMAL HIGH (ref 70–99)
POTASSIUM: 3.5 meq/L — AB (ref 3.7–5.3)
Potassium: 3.9 mEq/L (ref 3.7–5.3)
SODIUM: 142 meq/L (ref 137–147)
Sodium: 139 mEq/L (ref 137–147)

## 2014-02-15 LAB — URINALYSIS, ROUTINE W REFLEX MICROSCOPIC
BILIRUBIN URINE: NEGATIVE
Glucose, UA: NEGATIVE mg/dL
KETONES UR: NEGATIVE mg/dL
Leukocytes, UA: NEGATIVE
Nitrite: NEGATIVE
Protein, ur: NEGATIVE mg/dL
Specific Gravity, Urine: 1.019 (ref 1.005–1.030)
Urobilinogen, UA: 0.2 mg/dL (ref 0.0–1.0)
pH: 6 (ref 5.0–8.0)

## 2014-02-15 LAB — CBC
HEMATOCRIT: 42.6 % (ref 39.0–52.0)
HEMOGLOBIN: 14.6 g/dL (ref 13.0–17.0)
MCH: 32.2 pg (ref 26.0–34.0)
MCHC: 34.3 g/dL (ref 30.0–36.0)
MCV: 94 fL (ref 78.0–100.0)
Platelets: 176 10*3/uL (ref 150–400)
RBC: 4.53 MIL/uL (ref 4.22–5.81)
RDW: 12.9 % (ref 11.5–15.5)
WBC: 10.8 10*3/uL — AB (ref 4.0–10.5)

## 2014-02-15 LAB — URINE MICROSCOPIC-ADD ON

## 2014-02-15 LAB — CBC WITH DIFFERENTIAL/PLATELET
BASOS PCT: 0 % (ref 0–1)
Basophils Absolute: 0 10*3/uL (ref 0.0–0.1)
Eosinophils Absolute: 0.1 10*3/uL (ref 0.0–0.7)
Eosinophils Relative: 1 % (ref 0–5)
HCT: 39.6 % (ref 39.0–52.0)
HEMOGLOBIN: 13.2 g/dL (ref 13.0–17.0)
LYMPHS ABS: 1.2 10*3/uL (ref 0.7–4.0)
Lymphocytes Relative: 12 % (ref 12–46)
MCH: 31.6 pg (ref 26.0–34.0)
MCHC: 33.3 g/dL (ref 30.0–36.0)
MCV: 94.7 fL (ref 78.0–100.0)
MONO ABS: 1.2 10*3/uL — AB (ref 0.1–1.0)
MONOS PCT: 12 % (ref 3–12)
NEUTROS ABS: 7.5 10*3/uL (ref 1.7–7.7)
Neutrophils Relative %: 75 % (ref 43–77)
Platelets: 151 10*3/uL (ref 150–400)
RBC: 4.18 MIL/uL — AB (ref 4.22–5.81)
RDW: 12.9 % (ref 11.5–15.5)
WBC: 9.9 10*3/uL (ref 4.0–10.5)

## 2014-02-15 MED ORDER — ACETAMINOPHEN 650 MG RE SUPP
650.0000 mg | Freq: Four times a day (QID) | RECTAL | Status: DC | PRN
  Administered 2014-02-16: 650 mg via RECTAL
  Filled 2014-02-15 (×2): qty 1

## 2014-02-15 MED ORDER — PANTOPRAZOLE SODIUM 40 MG IV SOLR
40.0000 mg | Freq: Two times a day (BID) | INTRAVENOUS | Status: DC
  Administered 2014-02-15 – 2014-02-16 (×4): 40 mg via INTRAVENOUS
  Filled 2014-02-15 (×6): qty 40

## 2014-02-15 MED ORDER — IOHEXOL 300 MG/ML  SOLN: 50.0000 mL | Freq: Once | INTRAMUSCULAR | Status: AC | PRN

## 2014-02-15 NOTE — Progress Notes (Signed)
NG tube came out, reinsertion attempted by [facility], RN.  PA contacted for orders.  Attempted reinsertion with 18G ngt, unsuccessful x2.  PA contacted, orders for IR insertion.

## 2014-02-15 NOTE — Progress Notes (Signed)
Subjective: He says he feels better, high pitched BS, and he still feels a bit distended.  No nausea.  Ready to get his NG out.  Objective: Vital signs in last 24 hours: Temp:  [98.9 F (37.2 C)-99.8 F (37.7 C)] 99.5 F (37.5 C) (08/25 0554) Pulse Rate:  [77-86] 81 (08/25 0554) Resp:  [16-18] 16 (08/25 0554) BP: (98-127)/(62-79) 127/79 mmHg (08/25 0554) SpO2:  [91 %-98 %] 93 % (08/25 0554) Last BM Date: 02/13/14 (loose) 450 from the NG, 2 BM's recorded Tm 99.8, VSS K+ down some, otherwise normal labs Film shows increased SB dilatation.  I don't see the contrast in the colon which goes with BM's he is reporting. Intake/Output from previous day: 08/24 0701 - 08/25 0700 In: 2400 [I.V.:2400] Out: 1952 [Urine:1500; Emesis/NG output:450; Stool:2] Intake/Output this shift:    General appearance: alert, cooperative and no distress Resp: clear to auscultation bilaterally GI: soft, nontender he seems a little distended to me.  BS hyperactive, he reports 3 Bms since yesterday.  Lab Results:   Recent Labs  02/13/14 0604 02/15/14 0510  WBC 9.3 9.9  HGB 14.4 13.2  HCT 42.9 39.6  PLT 161 151    BMET  Recent Labs  02/13/14 0604 02/15/14 0510  NA 140 139  K 3.9 3.5*  CL 102 102  CO2 26 25  GLUCOSE 138* 124*  BUN 15 8  CREATININE 0.77 0.83  CALCIUM 8.6 8.4   PT/INR No results found for this basename: LABPROT, INR,  in the last 72 hours   Recent Labs Lab 02/12/14 1610  AST 35  ALT 28  ALKPHOS 53  BILITOT 2.0*  PROT 7.9  ALBUMIN 4.2     Lipase     Component Value Date/Time   LIPASE 16 02/12/2014 1610     Studies/Results: Dg Abd Acute W/chest  02/13/2014   CLINICAL DATA:  Abdominal pain.  EXAM: ACUTE ABDOMEN SERIES (ABDOMEN 2 VIEW & CHEST 1 VIEW)  COMPARISON:  CT scan of February 12, 2014.  FINDINGS: Nasogastric tube tip is seen in proximal stomach. Residual contrast is noted throughout the colon. Multilevel degenerative disc disease is noted in the lumbar  spine. Dilated small bowel loops are noted in the central portion the abdomen concerning for distal small bowel obstruction. Heart size and mediastinal contours are within normal limits. Both lungs are clear.  IMPRESSION: Dilated small bowel loops are noted concerning for distal small bowel partial obstruction. Residual contrast is noted throughout the colon.   Electronically Signed   By: Roque Lias M.D.   On: 02/13/2014 08:23    Medications: . antiseptic oral rinse  7 mL Mouth Rinse q12n4p  . bisacodyl  10 mg Rectal Daily  . chlorhexidine  15 mL Mouth Rinse BID  . heparin  5,000 Units Subcutaneous 3 times per day  . lip balm  1 application Topical BID  . metoprolol  5 mg Intravenous 4 times per day    Assessment/Plan SBO with hx of umbilical hernia repair.  Atrial fibrillation  Dextro scoliosis  Dyslipidemia  Body mass index is 29.83 kg/(m^2).   Plan:  If his film is OK I plan to d/c NG, and start clears, decrease IV fluids.  Film shows increased SB dilatation, but the contrast that was in the colon is gone.   I will try clamping and giving him some clears, but I am concerned he still has a partial obstruction and  is not done with the NG. I will recheck labs in  Am.  LOS: 3 days    Sherrie George 02/15/2014

## 2014-02-15 NOTE — Progress Notes (Signed)
General surgery attending:  I have interviewed and examined this patient twice this morning. I agree with the assessment and treatment plan outlined by Mr. Marlyne Beards, [place].  He is passing flatus and having liquid stools. He has no pain, taking a pending pain medication, denies nausea and states that he is hungry.  NG has been clamped. Abdominal x-rays show somewhat increased small bowel distention and all of this CT contrast that was in the colon has now been passed out. The NG tube is not visible. Most likely it is still up in the esophagus.  Abdominal exam reveals the abdomen is soft and nontender but it is a little bit distended and a little bit tympanitic.  Assessment/Plan: Radiographically he still looks like partial SBO, however clinically feels much better NG tube will be pushed in and repositioned. Leave NG clamped Trial of Clear liquids Check labs today Repeat x-rays tomorrow If this does not resolve, he may require laparotomy.    Angelia Mould. Derrell Lolling, M.D., [facility] General and Minimally invasive Surgery Breast and Colorectal Surgery Office:   [phone] Pager:   [phone]

## 2014-02-16 ENCOUNTER — Inpatient Hospital Stay (HOSPITAL_COMMUNITY): Payer: Medicare Other

## 2014-02-16 LAB — BASIC METABOLIC PANEL
ANION GAP: 15 (ref 5–15)
BUN: 6 mg/dL (ref 6–23)
CO2: 23 mEq/L (ref 19–32)
Calcium: 8.5 mg/dL (ref 8.4–10.5)
Chloride: 104 mEq/L (ref 96–112)
Creatinine, Ser: 0.82 mg/dL (ref 0.50–1.35)
GFR calc Af Amer: >90 mL/min (ref >90)
GFR, EST NON AFRICAN AMERICAN: 87 mL/min — AB (ref >90)
GLUCOSE: 134 mg/dL — AB (ref 70–99)
POTASSIUM: 3.6 meq/L — AB (ref 3.7–5.3)
SODIUM: 142 meq/L (ref 137–147)

## 2014-02-16 MED ORDER — BACID PO TABS
2.0000 | ORAL_TABLET | Freq: Three times a day (TID) | ORAL | Status: DC
  Filled 2014-02-16 (×3): qty 2

## 2014-02-16 NOTE — Progress Notes (Signed)
Subjective: NG tube was replaced under fluoroscopic guidance last minute. It remains clamped. He is tolerating a clear liquid diet without nausea. Denies abdominal pain. Passing flatus and liquid stool.  Abdominal x-rays look better. Significantly less bowel dilatation. Air in colon. NG tube properly positioned in the stomach.  Laboratory reveals potassium 3.6, Creatinine 0.82, glucose 134.  Objective: Vital signs in last 24 hours: Temp:  [98.6 F (37 C)-100.1 F (37.8 C)] 99.2 F (37.3 C) March 18, 2023 4098) Pulse Rate:  [70-94] 80 03/18/2023 0633) Resp:  [16] 16 Mar 18, 2023 0633) BP: (121-149)/(59-76) 149/76 mmHg 03/18/23 0633) SpO2:  [95 %-97 %] 96 % 2023/03/18 0633) Last BM Date: 02/15/14  Intake/Output from previous day: 08/25 0701 - 03-18-23 0700 In: 3031.7 [P.O.:540; I.V.:2371.7; NG/GT:60] Out: 2530 [Urine:2530] Intake/Output this shift:    General appearance: alert. No distress. Mental status normal Resp: clear to auscultation bilaterally GI: abdomen softer, nontender, less distended. Bowel sounds throughout normal.  Lab Results:   Recent Labs  02/15/14 0510 02/15/14 1140  WBC 9.9 10.8*  HGB 13.2 14.6  HCT 39.6 42.6  PLT 151 176   BMET  Recent Labs  02/15/14 1140 03/17/14 0510  NA 142 142  K 3.9 3.6*  CL 103 104  CO2 26 23  GLUCOSE 119* 134*  BUN 8 6  CREATININE 0.85 0.82  CALCIUM 8.9 8.5   PT/INR No results found for this basename: LABPROT, INR,  in the last 72 hours ABG No results found for this basename: PHART, PCO2, PO2, HCO3,  in the last 72 hours  Studies/Results: Dg Abd 2 Views  March 17, 2014   CLINICAL DATA:  Recent bowel obstruction  EXAM: ABDOMEN - 2 VIEW  COMPARISON:  February 15, 2014  FINDINGS: Supine and upright images were obtained. Nasogastric tube tip and side port are in the stomach. Compared to 1 day prior, there is significantly less bowel dilatation. There are scattered air-fluid levels. No free air is appreciable. A small amount of oral contrast is  seen in the lateral right abdomen.  IMPRESSION: The appearance is consistent with partial but incomplete resolution of bowel obstruction. No free air. Nasogastric tube tip and side port within the stomach.   Electronically Signed   By: Bretta Bang M.D.   On: March 17, 2014 08:04   Dg Abd 2 Views  02/15/2014   CLINICAL DATA:  Abdominal distention, small bowel obstruction.  EXAM: ABDOMEN - 2 VIEW  COMPARISON:  February 13, 2014.  FINDINGS: Nasogastric tube has been removed. There is increased small bowel dilatation since prior exam concerning for distal small bowel obstruction. No colonic dilatation is noted. Moderate dextroscoliosis of the lumbar spine is noted as well as multilevel degenerative disc disease.  IMPRESSION: Increased small bowel dilatation is noted compared to prior exam concerning for distal small bowel obstruction.   Electronically Signed   By: Roque Lias M.D.   On: 02/15/2014 08:19   Dg Basil Dess Tube Plc W/fl W/rad  02/15/2014   CLINICAL DATA:  Inability to replace nasogastric tube at the bedside. Preexisting right-sided nasogastric tube.  EXAM: NASO G TUBE PLACEMENT WITH FL AND WITH RAD  TECHNIQUE: Procedure was discussed with the patient who agreed to proceed. After lubricating the nostrils with viscous lidocaine, the 16 French nasogastric tube was inserted via the right nares. The tube did not pass spontaneously beyond the nasopharynx. Under lateral fluoroscopy, the tube was manipulated into the cervical esophagus. It was then advanced under intermittent fluoroscopy, initially extending into the trachea. The tube was withdrawn and  redirected into the esophagus. The tube was passed into the mid stomach and taped in place. There were no immediate complications, although the patient did have some bleeding from the right nares.  FLUOROSCOPY TIME:  2 min and 36 seconds.  COMPARISON:  Abdominal radiographs same date.  FINDINGS: See technique.  IMPRESSION: Nasogastric tube placement under  fluoroscopy.   Electronically Signed   By: Roxy Horseman M.D.   On: 02/15/2014 15:05    Anti-infectives: Anti-infectives   None      Assessment/Plan:  Partial SBO. This seems to be resolving. Leave NG tube in but leave clamped Full liquid diet Possible remove NG tomorrow    LOS: 4 days    Alonda Weaber M 02/16/2014

## 2014-02-17 ENCOUNTER — Inpatient Hospital Stay (HOSPITAL_COMMUNITY): Payer: Medicare Other

## 2014-02-17 LAB — URINALYSIS, ROUTINE W REFLEX MICROSCOPIC
Bilirubin Urine: NEGATIVE
Glucose, UA: NEGATIVE mg/dL
Ketones, ur: NEGATIVE mg/dL
Leukocytes, UA: NEGATIVE
NITRITE: NEGATIVE
Protein, ur: NEGATIVE mg/dL
SPECIFIC GRAVITY, URINE: 1.011 (ref 1.005–1.030)
UROBILINOGEN UA: 1 mg/dL (ref 0.0–1.0)
pH: 6 (ref 5.0–8.0)

## 2014-02-17 LAB — BASIC METABOLIC PANEL
ANION GAP: 13 (ref 5–15)
BUN: 6 mg/dL (ref 6–23)
CHLORIDE: 104 meq/L (ref 96–112)
CO2: 24 mEq/L (ref 19–32)
CREATININE: 0.77 mg/dL (ref 0.50–1.35)
Calcium: 8.5 mg/dL (ref 8.4–10.5)
GFR calc Af Amer: >90 mL/min (ref >90)
GFR calc non Af Amer: 89 mL/min — ABNORMAL LOW (ref >90)
Glucose, Bld: 124 mg/dL — ABNORMAL HIGH (ref 70–99)
POTASSIUM: 3.8 meq/L (ref 3.7–5.3)
SODIUM: 141 meq/L (ref 137–147)

## 2014-02-17 LAB — CBC
HCT: 38.1 % — ABNORMAL LOW (ref 39.0–52.0)
Hemoglobin: 13 g/dL (ref 13.0–17.0)
MCH: 31.4 pg (ref 26.0–34.0)
MCHC: 34.1 g/dL (ref 30.0–36.0)
MCV: 92 fL (ref 78.0–100.0)
PLATELETS: 186 10*3/uL (ref 150–400)
RBC: 4.14 MIL/uL — AB (ref 4.22–5.81)
RDW: 12.7 % (ref 11.5–15.5)
WBC: 9.1 10*3/uL (ref 4.0–10.5)

## 2014-02-17 LAB — URINE MICROSCOPIC-ADD ON

## 2014-02-17 MED ORDER — METOPROLOL TARTRATE 25 MG PO TABS: 25.0000 mg | ORAL_TABLET | ORAL | Status: AC

## 2014-02-17 MED ORDER — ATORVASTATIN CALCIUM 10 MG PO TABS
10.0000 mg | ORAL_TABLET | Freq: Every day | ORAL | Status: DC
  Administered 2014-02-17: 10 mg via ORAL
  Filled 2014-02-17 (×3): qty 1

## 2014-02-17 MED ORDER — PANTOPRAZOLE SODIUM 40 MG PO TBEC
40.0000 mg | DELAYED_RELEASE_TABLET | Freq: Two times a day (BID) | ORAL | Status: DC
  Administered 2014-02-17 (×2): 40 mg via ORAL
  Filled 2014-02-17 (×4): qty 1

## 2014-02-17 MED ORDER — ASPIRIN EC 81 MG PO TBEC
81.0000 mg | DELAYED_RELEASE_TABLET | Freq: Every day | ORAL | Status: DC
  Administered 2014-02-17: 81 mg via ORAL
  Filled 2014-02-17 (×2): qty 1

## 2014-02-17 MED ORDER — PROMETHAZINE HCL 25 MG/ML IJ SOLN: 6.2500 mg | Freq: Four times a day (QID) | INTRAMUSCULAR | Status: DC | PRN

## 2014-02-17 NOTE — Progress Notes (Signed)
Rt gave pt flutter valve. Pt knows and understands how to use. 

## 2014-02-17 NOTE — Progress Notes (Addendum)
Pt brought in his home bipap and prefers to wear it while here in the hospital.  RT setup his home bipap on bedside table.  No frays on cord or obvious defects noted.  Machine turns on and appears to be working correctly.  Wife at bedside added sterile water to humidity chamber.  Pt states he feels comfortable placing it on himself when ready for bed.  Pt was advised that RT is available all night and encouraged him to call should he need further assistance.  RN aware.  Call will be made requesting [employer] to inspect patient's home machine.

## 2014-02-17 NOTE — Progress Notes (Addendum)
Subjective: Alert. States he feels fine. Tolerating full liquids with NG clamp. Passing lots of flatus. Stool yesterday. Temp to 102. Asymptiomatic. Tylenol last night . Afebrile now.  Apparently he is treated for sleep apnea by a physician in [facility]. He has a BiPAP machine at home. I've asked his wife to bring this in and will have RT set this up this afternoon.  Labwork reveals WBC 9100. Hemoglobin 13.0. Creatinine 0.77. Glucose 124. Potassium 3.8.  Objective: Vital signs in last 24 hours: Temp:  [98.2 F (36.8 C)-102.3 F (39.1 C)] 98.2 F (36.8 C) (08/27 0556) Pulse Rate:  [72-88] 72 (08/27 0556) Resp:  [16-18] 18 (08/27 0556) BP: (117-149)/(58-76) 120/65 mmHg (08/27 0556) SpO2:  [94 %-96 %] 94 % (08/27 0556) Last BM Date: 02/15/14  Intake/Output from previous day: 2023/03/05 0701 - 08/27 0700 In: 3580 [P.O.:1080; I.V.:2200; NG/GT:120] Out: 2350 [Urine:2350] Intake/Output this shift: Total I/O In: 1768.4 [P.O.:480; I.V.:988.4; Other:180; NG/GT:120] Out: 1350 [Urine:1350]  General appearance: alert. Cooperative.  Mental status normal. No distress. Resp: clear to auscultation bilaterally GI: abdominal exam unremarkable. Soft. Not distended. Not tender. Not tympanitic. No mass or hernia. Bowel sounds present.  Lab Results:   Recent Labs  02/15/14 1140 02/17/14 0453  WBC 10.8* 9.1  HGB 14.6 13.0  HCT 42.6 38.1*  PLT 176 186   BMET  Recent Labs  03-04-14 0510 02/17/14 0453  NA 142 141  K 3.6* 3.8  CL 104 104  CO2 23 24  GLUCOSE 134* 124*  BUN 6 6  CREATININE 0.82 0.77  CALCIUM 8.5 8.5   PT/INR No results found for this basename: LABPROT, INR,  in the last 72 hours ABG No results found for this basename: PHART, PCO2, PO2, HCO3,  in the last 72 hours  Studies/Results: Dg Abd 2 Views  03/04/2014   CLINICAL DATA:  Recent bowel obstruction  EXAM: ABDOMEN - 2 VIEW  COMPARISON:  February 15, 2014  FINDINGS: Supine and upright images were obtained.  Nasogastric tube tip and side port are in the stomach. Compared to 1 day prior, there is significantly less bowel dilatation. There are scattered air-fluid levels. No free air is appreciable. A small amount of oral contrast is seen in the lateral right abdomen.  IMPRESSION: The appearance is consistent with partial but incomplete resolution of bowel obstruction. No free air. Nasogastric tube tip and side port within the stomach.   Electronically Signed   By: Bretta Bang M.D.   On: 2014-03-04 08:04   Dg Abd 2 Views  02/15/2014   CLINICAL DATA:  Abdominal distention, small bowel obstruction.  EXAM: ABDOMEN - 2 VIEW  COMPARISON:  February 13, 2014.  FINDINGS: Nasogastric tube has been removed. There is increased small bowel dilatation since prior exam concerning for distal small bowel obstruction. No colonic dilatation is noted. Moderate dextroscoliosis of the lumbar spine is noted as well as multilevel degenerative disc disease.  IMPRESSION: Increased small bowel dilatation is noted compared to prior exam concerning for distal small bowel obstruction.   Electronically Signed   By: Roque Lias M.D.   On: 02/15/2014 08:19   Dg Basil Dess Tube Plc W/fl W/rad  02/15/2014   CLINICAL DATA:  Inability to replace nasogastric tube at the bedside. Preexisting right-sided nasogastric tube.  EXAM: NASO G TUBE PLACEMENT WITH FL AND WITH RAD  TECHNIQUE: Procedure was discussed with the patient who agreed to proceed. After lubricating the nostrils with viscous lidocaine, the 16 French nasogastric tube was inserted via  the right nares. The tube did not pass spontaneously beyond the nasopharynx. Under lateral fluoroscopy, the tube was manipulated into the cervical esophagus. It was then advanced under intermittent fluoroscopy, initially extending into the trachea. The tube was withdrawn and redirected into the esophagus. The tube was passed into the mid stomach and taped in place. There were no immediate complications, although  the patient did have some bleeding from the right nares.  FLUOROSCOPY TIME:  2 min and 36 seconds.  COMPARISON:  Abdominal radiographs same date.  FINDINGS: See technique.  IMPRESSION: Nasogastric tube placement under fluoroscopy.   Electronically Signed   By: Roxy Horseman M.D.   On: 02/15/2014 15:05    Anti-infectives: Anti-infectives   None      Assessment/Plan:  Partial SBO. Presumably secondary to adhesions. Resolving Discontinue NG tube Advance diet to low residue  Fever.  CXR UA  Obstructive sleep apnea. RT consult for BiPAP at night  Hypertension Restart oral  Lopressor, oral aspirin, Lipitor. Discontinue IV Lopressor.   LOS: 5 days    [place] Antolin M 02/17/2014

## 2014-02-18 NOTE — Progress Notes (Signed)
Discharge instructions given to patient.  Patient discharged via wheelchair to car

## 2014-02-18 NOTE — Progress Notes (Signed)
  Subjective: Tolerating full liquids. Had another Bowel movement. Feels good. Most to go home.  Uses BiPAP machine last night and  slept better.  Chest x-ray showed just a little streaky atelectasis. Using incentive spirometer and flutter valve. Fevers have resolved.  Objective: Vital signs in last 24 hours: Temp:  [97.6 F (36.4 C)-98.5 F (36.9 C)] 97.6 F (36.4 C) (08/28 0517) Pulse Rate:  [72-82] 81 (08/28 0517) Resp:  [17-18] 17 (08/28 0517) BP: (116-148)/(76-83) 116/76 mmHg (08/28 0517) SpO2:  [92 %-97 %] 97 % (08/28 0517) Last BM Date: 02/17/14  Intake/Output from previous day: 08/27 0701 - 08/28 0700 In: 1765 [P.O.:720; I.V.:1045] Out: 1950 [Urine:1950] Intake/Output this shift: Total I/O In: 370 [P.O.:120; I.V.:250] Out: 1350 [Urine:1350]  General appearance: alert, no distress, mental status normal. Resp: clear to auscultation bilaterally GI: abdomen soft. Nontender. Nondistended. Normal bowel sounds.  Lab Results:   Recent Labs  02/15/14 1140 02/17/14 0453  WBC 10.8* 9.1  HGB 14.6 13.0  HCT 42.6 38.1*  PLT 176 186   BMET  Recent Labs  02/16/14 0510 02/17/14 0453  NA 142 141  K 3.6* 3.8  CL 104 104  CO2 23 24  GLUCOSE 134* 124*  BUN 6 6  CREATININE 0.82 0.77  CALCIUM 8.5 8.5   PT/INR No results found for this basename: LABPROT, INR,  in the last 72 hours ABG No results found for this basename: PHART, PCO2, PO2, HCO3,  in the last 72 hours  Studies/Results: Dg Chest 2 View  02/17/2014   CLINICAL DATA:  Cough, fever  EXAM: CHEST  2 VIEW  COMPARISON:  02/13/2014  FINDINGS: Cardiomediastinal silhouette is stable. No pulmonary edema. There is streaky left base retrocardiac atelectasis or infiltrate.  IMPRESSION: Streaky left base retrocardiac atelectasis or infiltrate. No pulmonary edema.   Electronically Signed   By: Natasha Mead M.D.   On: 02/17/2014 08:59   Dg Abd 2 Views  02/16/2014   CLINICAL DATA:  Recent bowel obstruction  EXAM:  ABDOMEN - 2 VIEW  COMPARISON:  February 15, 2014  FINDINGS: Supine and upright images were obtained. Nasogastric tube tip and side port are in the stomach. Compared to 1 day prior, there is significantly less bowel dilatation. There are scattered air-fluid levels. No free air is appreciable. A small amount of oral contrast is seen in the lateral right abdomen.  IMPRESSION: The appearance is consistent with partial but incomplete resolution of bowel obstruction. No free air. Nasogastric tube tip and side port within the stomach.   Electronically Signed   By: Bretta Bang M.D.   On: 02/16/2014 08:04    Anti-infectives: Anti-infectives   None      Assessment/Plan:  Partial SBO. Presumably secondary to adhesions. Resolving  Low fat diet Home later today No further diagnostic intervention warranted.   Fever.  There is no clinical, radiographic, or laboratory evidence of infection. Suspect this was due to atelectasis. Self-limited.  Obstructive sleep apnea.  BiPAP at night   Hypertension   oral Lopressor, oral aspirin, Lipitor. Home meds.      LOS: 6 days    Micca Matura M 02/18/2014

## 2014-02-18 NOTE — Care Management Note (Signed)
    Page 1 of 1   02/18/2014     10:42:16 AM CARE MANAGEMENT NOTE 02/18/2014  Patient:  [facility]   Account Number:  [id number]  Date Initiated:  02/16/2014  Documentation initiated by:  Lorenda Ishihara  Subjective/Objective Assessment:   73 yo male admitted with bowel obstruction. PTA lived at home with spouse.     Action/Plan:   Home when stable   Anticipated DC Date:  02/19/2014   Anticipated DC Plan:  HOME/SELF CARE      DC Planning Services  CM consult      Choice offered to / List presented to:             Status of service:  Completed, signed off Medicare Important Message given?  YES (If response is "NO", the following Medicare IM given date fields will be blank) Date Medicare IM given:  02/17/2014 Medicare IM given by:  [facility] Date Additional Medicare IM given:   Additional Medicare IM given by:    Discharge Disposition:  HOME/SELF CARE  Per UR Regulation:  Reviewed for med. necessity/level of care/duration of stay  If discussed at Long Length of Stay Meetings, dates discussed:    Comments:

## 2014-02-18 NOTE — Discharge Summary (Signed)
Physician Discharge Summary  Patient ID: Clayton Daniels MRN: [id number] DOB/AGE: [birth year] 73 y.o.  Admit date: 02/12/2014 Discharge date: 02/18/2014  Admission Diagnoses:  SBO with hx of umbilical hernia repair.  Atrial fibrillation  Dextro scoliosis  Dyslipidemia  Body mass index is 29.83 kg/(m^2).  Sleep apnea with Bipap   Discharge Diagnoses:  SBO with hx of umbilical hernia repair.  Atrial fibrillation  Dextro scoliosis  Dyslipidemia  Body mass index is 29.83 kg/(m^2).  Sleep apnea with BiPAP Principal Problem:   SBO (small bowel obstruction) Active Problems:   Steatohepatitis, nonalcoholic   Obesity (BMI 30-39.9)   PROCEDURES: none  Hospital Course: Elderly male with history of atrial fibrillation and prior abdominal surgery. Still works part-time. Rather active. It comes today with his wife. Emergency room nurse in room. Began to have lower abdominal pain & then mainly right lower quadrant starting yesterday. Had some watery bowel movements yesterday but nothing today. Increased belching. No flatus. Increasing nausea and pain. Anorexia. Went to Urgent care Center. He tends to have MDs in [place] and [place]. Had prior inguinal hernia repairs decades ago. Had an umbilical hernia repair done a year or 2 ago. CT scan suspicious for bowel obstruction. Sent to [facility] emergency room. Surgical consultation requested. Patient transferred to [facility] so that surgery could see.  Patient claims in walk 20 minutes without difficulty. Had an episode of atrial fibrillation that was controlled by cardiology a few years ago in [place]. Stable on metoprolol. No anticoagulation aside from 81 mg aspirin. Recalls normal colonoscopy 5 years ago. No personal nor family history of GI/colon cancer, inflammatory bowel disease, irritable bowel syndrome, allergy such as Celiac Sprue, dietary/dairy problems, colitis, ulcers nor gastritis. No recent sick  contacts/gastroenteritis. No travel outside the country. No changes in diet. No dysphagia to solids or liquids. No significant heartburn or reflux. No hematochezia, hematemesis, coffee ground emesis. No evidence of prior gastric/peptic ulceration. Pt was admitted and place on bowel rest, NG decompression and hydration.  He made very slow progress. His NG came out before we thought it was ready and had to be replaced by IR.  After that he started to open up.  He developed a  Fever and Xrays showed some atelectasis. This resolved with additional pulmonary toilet and increased ambulation.  We advanced him to regular diet yesterday and he has done well.  It is Dr. Jacinto Halim opinion he is ready for discharge.   Condition on d/c:  Improved       Disposition: Final discharge disposition not confirmed     Medication List         aspirin 81 MG chewable tablet  Chew 81 mg by mouth daily.     atorvastatin 10 MG tablet  Commonly known as:  LIPITOR  Take 10 mg by mouth daily.     metoprolol succinate 25 MG 24 hr tablet  Commonly known as:  TOPROL-XL  Take 25 mg by mouth daily.     multivitamin with minerals Tabs tablet  Take 1 tablet by mouth daily. Men's 50 plus     naproxen sodium 220 MG tablet  Commonly known as:  ANAPROX  Take 220 mg by mouth 2 (two) times daily as needed (for pain.).     TURMERIC CURCUMIN PO  Take 1 capsule by mouth 2 (two) times daily.     white petrolatum Gel  Commonly known as:  VASELINE  Apply 1 application topically daily. Applied to area of head once  daily     Zinc 50 MG Caps  Take 50 mg by mouth daily.       Follow-up Information   Schedule an appointment as soon as possible for a visit with Karle Plumber, MD. (Follow up with him and let him know about your hospitalization.)    Specialty:  Internal Medicine   Contact information:   [phone] [place] [address] [phone]       Follow up with Ernestene Mention, MD. (As needed, you can  call if we can help.)    Specialty:  General Surgery   Contact information:   [address] [place] [address] [phone]       Signed: Sherrie George 02/18/2014, 8:59 AM

## 2014-02-18 NOTE — Discharge Instructions (Signed)
Small Bowel Obstruction A small bowel obstruction is a blockage (obstruction) of the small intestine (small bowel). The small bowel is a long, slender tube that connects the stomach to the colon. Its job is to absorb nutrients from the fluids and foods you consume into the bloodstream.  CAUSES  There are many causes of intestinal blockage. The most common ones include:  Hernias. This is a more common cause in children than adults.  Inflammatory bowel disease (enteritis and colitis).  Twisting of the bowel (volvulus).  Tumors.  Scar tissue (adhesions) from previous surgery or radiation treatment.  Recent surgery. This may cause an acute small bowel obstruction called an ileus. SYMPTOMS   Abdominal pain. This may be dull cramps or sharp pain. It may occur in one area or may be present in the entire abdomen. Pain can range from mild to severe, depending on the degree of obstruction.  Nausea and vomiting. Vomit may be greenish or yellow bile color.  Distended or swollen stomach. Abdominal bloating is a common symptom.  Constipation.  Lack of passing gas.  Frequent belching.  Diarrhea. This may occur if runny stool is able to leak around the obstruction. DIAGNOSIS  Your caregiver can usually diagnose small bowel obstruction by taking a history, doing a physical exam, and taking X-rays. If the cause is unclear, a CT scan (computerized tomography) of your abdomen and pelvis may be needed. TREATMENT  Treatment of the blockage depends on the cause and how bad the problem is.   Sometimes, the obstruction improves with bed rest and intravenous (IV) fluids.  Resting the bowel is very important. This means following a simple diet. Sometimes, a clear liquid diet may be required for several days.  Sometimes, a small tube (nasogastric tube) is placed into the stomach to decompress the bowel. When the bowel is blocked, it usually swells up like a balloon filled with air and fluids.  Decompression means that the air and fluids are removed by suction through that tube. This can help with pain, discomfort, and nausea. It can also help the obstruction resolve faster.  Surgery may be required if other treatments do not work. Bowel obstruction from a hernia may require early surgery and can be an emergency procedure. Adhesions that cause frequent or severe obstructions may also require surgery. HOME CARE INSTRUCTIONS If your bowel obstruction is only partial or incomplete, you may be allowed to go home.  Get plenty of rest.  Follow your diet as directed by your caregiver.  Only consume clear liquids until your condition improves.  Avoid solid foods as instructed. SEEK IMMEDIATE MEDICAL CARE IF:  You have increased pain or cramping.  You vomit blood.  You have uncontrolled vomiting or nausea.  You cannot drink fluids due to vomiting or pain.  You develop confusion.  You begin feeling very dry or thirsty (dehydrated).  You have severe bloating.  You have chills.  You have a fever.  You feel extremely weak or you faint. MAKE SURE YOU:  Understand these instructions.  Will watch your condition.  Will get help right away if you are not doing well or get worse. Document Released: 08/27/2005 Document Revised: 09/02/2011 Document Reviewed: 08/24/2010 [facility] Patient Information 2015 [place], [place]. This information is not intended to replace advice given to you by your health care provider. Make sure you discuss any questions you have with your health care provider. We did not find any other issues during your stay here.

## 2014-02-18 NOTE — Discharge Summary (Signed)
Patient seen and examined. Agree with discharge plans. See my earlier note.   Clayton Daniels. Derrell Lolling, M.D., [facility] General and Minimally invasive Surgery Breast and Colorectal Surgery Office:   [phone] Pager:   [phone]

## 2014-02-22 NOTE — ED Provider Notes (Signed)
Medical screening examination/treatment/procedure(s) were performed by non-physician practitioner and as supervising physician I was immediately available for consultation/collaboration.   EKG Interpretation None       Hurman Horn, MD 02/22/14 1925

## 2015-06-03 IMAGING — CR DG ABDOMEN 2V
2 series · 2 of 2 positions shown · non-contrast
Comparison: February 13, 2014.

CLINICAL DATA: Abdominal distention, small bowel obstruction.

EXAM:
ABDOMEN - 2 VIEW

[w abdomen upright *]
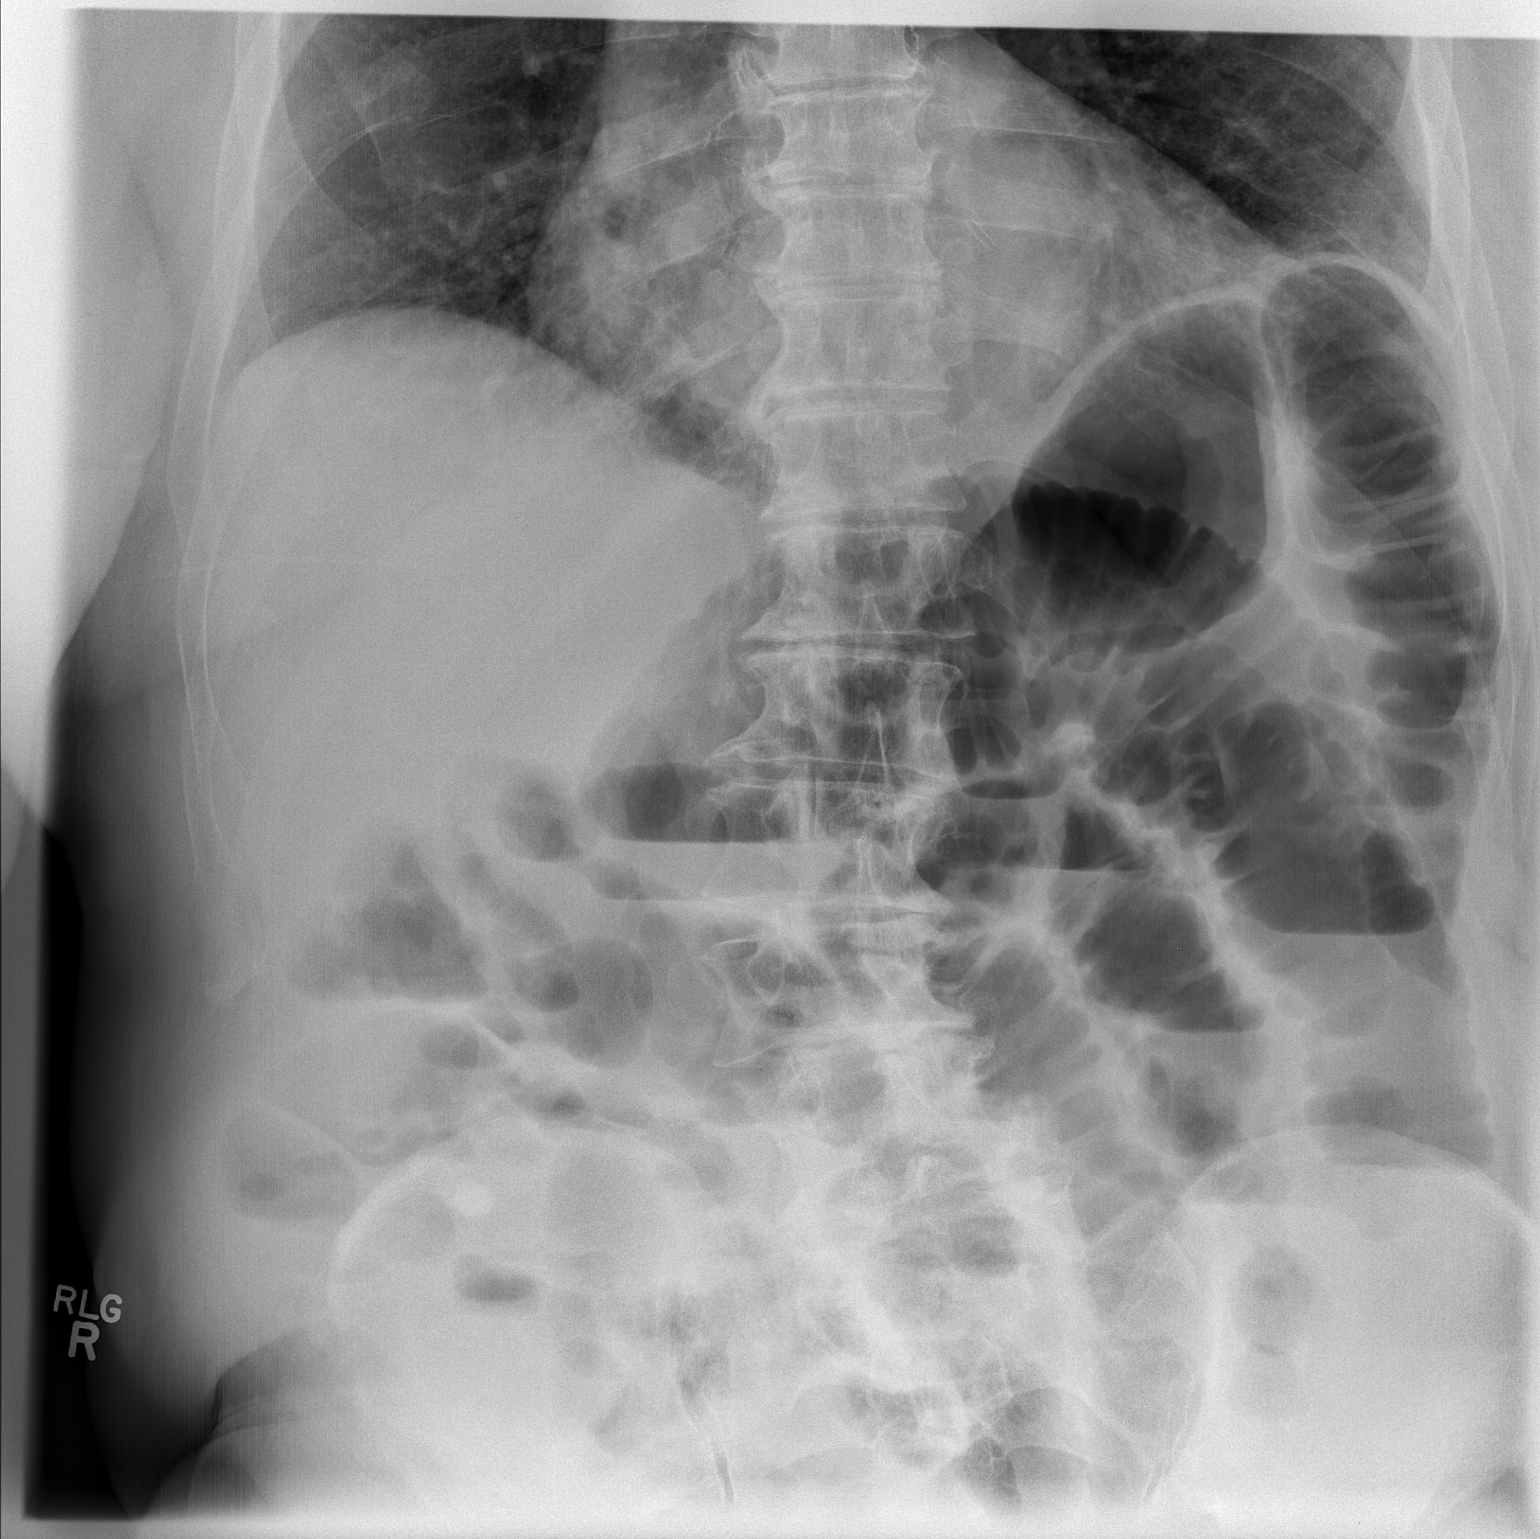

[t abdomen supine]
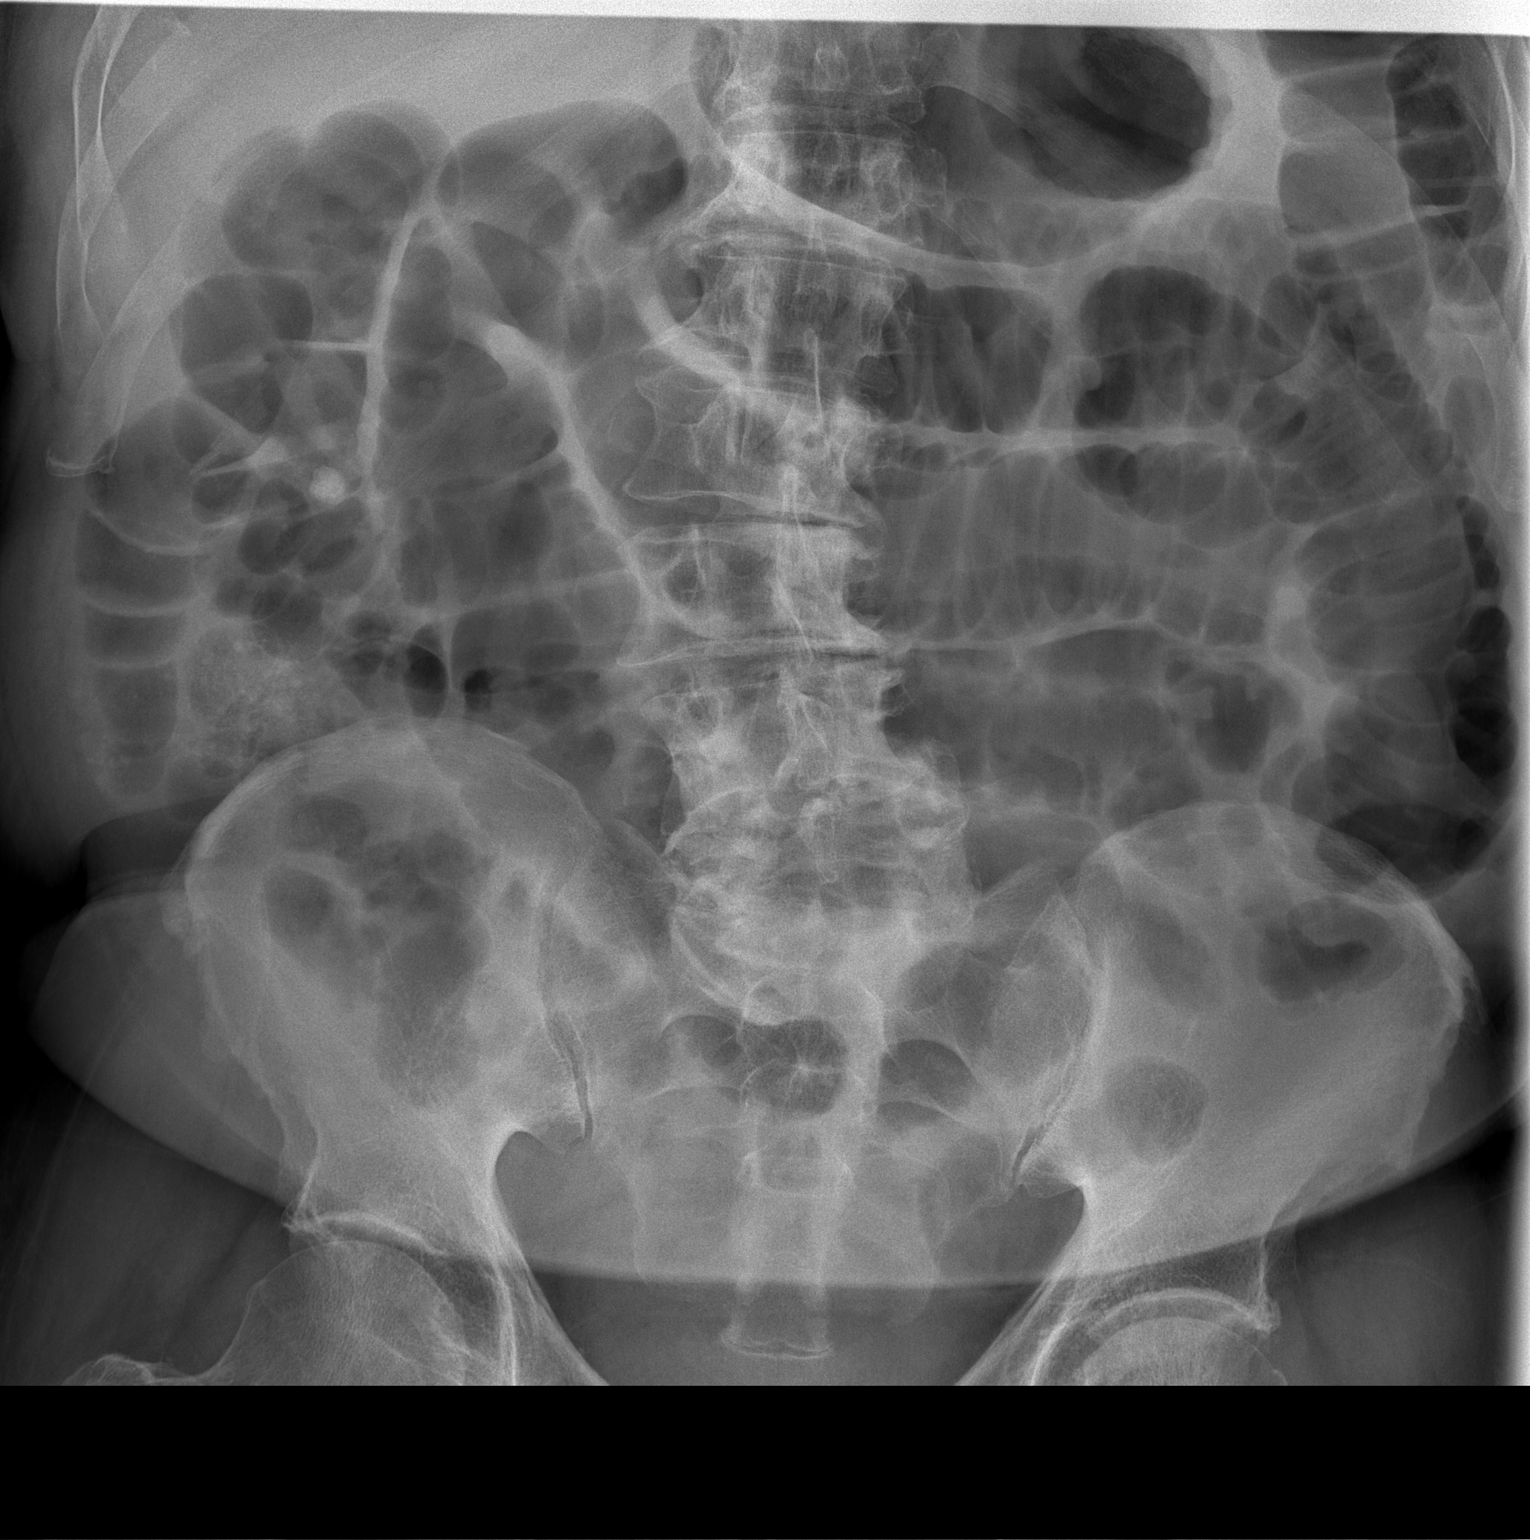

[2 of 2 positions shown; findings below may reference images not displayed]

FINDINGS: Nasogastric tube has been removed. There is increased small bowel
dilatation since prior exam concerning for distal small bowel
obstruction. No colonic dilatation is noted. Moderate
dextroscoliosis of the lumbar spine is noted as well as multilevel
degenerative disc disease.
IMPRESSION: Increased small bowel dilatation is noted compared to prior exam
concerning for distal small bowel obstruction.

## 2022-10-26 ENCOUNTER — Encounter (HOSPITAL_BASED_OUTPATIENT_CLINIC_OR_DEPARTMENT_OTHER): Payer: Self-pay | Admitting: Emergency Medicine

## 2022-10-26 ENCOUNTER — Other Ambulatory Visit: Payer: Self-pay

## 2022-10-26 ENCOUNTER — Emergency Department (HOSPITAL_BASED_OUTPATIENT_CLINIC_OR_DEPARTMENT_OTHER)
Admission: EM | Admit: 2022-10-26 | Discharge: 2022-10-26 | Disposition: A | Discharge status: Home / Self Care | Payer: Medicare Other | Attending: Emergency Medicine | Admitting: Emergency Medicine

## 2022-10-26 ENCOUNTER — Emergency Department (HOSPITAL_BASED_OUTPATIENT_CLINIC_OR_DEPARTMENT_OTHER): Payer: Medicare Other

## 2022-10-26 DIAGNOSIS — R0602 Shortness of breath: Secondary | ICD-10-CM | POA: Diagnosis not present

## 2022-10-26 DIAGNOSIS — Z7982 Long term (current) use of aspirin: Secondary | ICD-10-CM | POA: Diagnosis not present

## 2022-10-26 DIAGNOSIS — J168 Pneumonia due to other specified infectious organisms: Secondary | ICD-10-CM | POA: Insufficient documentation

## 2022-10-26 DIAGNOSIS — R059 Cough, unspecified: Secondary | ICD-10-CM | POA: Diagnosis present

## 2022-10-26 DIAGNOSIS — Z1152 Encounter for screening for COVID-19: Secondary | ICD-10-CM | POA: Insufficient documentation

## 2022-10-26 DIAGNOSIS — J189 Pneumonia, unspecified organism: Secondary | ICD-10-CM

## 2022-10-26 HISTORY — DX: Malignant neoplasm of prostate: C61

## 2022-10-26 LAB — BASIC METABOLIC PANEL
Anion gap: 9 (ref 5–15)
BUN: 20 mg/dL (ref 8–23)
CO2: 26 mmol/L (ref 22–32)
Calcium: 8.7 mg/dL — ABNORMAL LOW (ref 8.9–10.3)
Chloride: 103 mmol/L (ref 98–111)
Creatinine, Ser: 0.79 mg/dL (ref 0.61–1.24)
GFR, Estimated: >60 mL/min (ref >60)
Glucose, Bld: 111 mg/dL — ABNORMAL HIGH (ref 70–99)
Potassium: 3.7 mmol/L (ref 3.5–5.1)
Sodium: 138 mmol/L (ref 135–145)

## 2022-10-26 LAB — CBC WITH DIFFERENTIAL/PLATELET
Abs Immature Granulocytes: 0.04 10*3/uL (ref 0.00–0.07)
Basophils Absolute: 0 10*3/uL (ref 0.0–0.1)
Basophils Relative: 0 %
Eosinophils Absolute: 0.2 10*3/uL (ref 0.0–0.5)
Eosinophils Relative: 3 %
HCT: 37.9 % — ABNORMAL LOW (ref 39.0–52.0)
Hemoglobin: 12.6 g/dL — ABNORMAL LOW (ref 13.0–17.0)
Immature Granulocytes: 1 %
Lymphocytes Relative: 17 %
Lymphs Abs: 1.3 10*3/uL (ref 0.7–4.0)
MCH: 31.7 pg (ref 26.0–34.0)
MCHC: 33.2 g/dL (ref 30.0–36.0)
MCV: 95.2 fL (ref 80.0–100.0)
Monocytes Absolute: 0.8 10*3/uL (ref 0.1–1.0)
Monocytes Relative: 10 %
Neutro Abs: 5.2 10*3/uL (ref 1.7–7.7)
Neutrophils Relative %: 69 %
Platelets: 258 10*3/uL (ref 150–400)
RBC: 3.98 MIL/uL — ABNORMAL LOW (ref 4.22–5.81)
RDW: 13.1 % (ref 11.5–15.5)
WBC: 7.5 10*3/uL (ref 4.0–10.5)
nRBC: 0 % (ref 0.0–0.2)

## 2022-10-26 LAB — BRAIN NATRIURETIC PEPTIDE: B Natriuretic Peptide: 103.3 pg/mL — ABNORMAL HIGH (ref 0.0–100.0)

## 2022-10-26 LAB — SARS CORONAVIRUS 2 BY RT PCR: SARS Coronavirus 2 by RT PCR: NEGATIVE

## 2022-10-26 LAB — LACTIC ACID, PLASMA: Lactic Acid, Venous: 1 mmol/L (ref 0.5–1.9)

## 2022-10-26 MED ORDER — DOXYCYCLINE HYCLATE 100 MG PO CAPS: 100.0000 mg | ORAL_CAPSULE | Freq: Two times a day (BID) | ORAL | 0 refills | Status: AC

## 2022-10-26 MED ORDER — ALBUTEROL SULFATE HFA 108 (90 BASE) MCG/ACT IN AERS
2.0000 | INHALATION_SPRAY | Freq: Once | RESPIRATORY_TRACT | Status: AC
  Administered 2022-10-26: 2 via RESPIRATORY_TRACT
  Filled 2022-10-26: qty 6.7

## 2022-10-26 MED ORDER — DOXYCYCLINE HYCLATE 100 MG PO TABS
100.0000 mg | ORAL_TABLET | Freq: Two times a day (BID) | ORAL | Status: DC
  Administered 2022-10-26: 100 mg via ORAL
  Filled 2022-10-26: qty 1

## 2022-10-26 MED ORDER — METHYLPREDNISOLONE 4 MG PO TBPK: ORAL_TABLET | ORAL | 0 refills | Status: AC

## 2022-10-26 MED ORDER — BENZONATATE 100 MG PO CAPS: 100.0000 mg | ORAL_CAPSULE | Freq: Three times a day (TID) | ORAL | 0 refills | Status: AC

## 2022-10-26 MED ORDER — SODIUM CHLORIDE 0.9 % IV SOLN
2.0000 g | Freq: Every day | INTRAVENOUS | Status: DC
  Administered 2022-10-26: 2 g via INTRAVENOUS
  Filled 2022-10-26: qty 20

## 2022-10-26 MED ORDER — CEPHALEXIN 500 MG PO CAPS: 500.0000 mg | ORAL_CAPSULE | Freq: Three times a day (TID) | ORAL | 0 refills | Status: AC

## 2022-10-26 MED ORDER — BENZONATATE 100 MG PO CAPS
100.0000 mg | ORAL_CAPSULE | Freq: Once | ORAL | Status: AC
  Administered 2022-10-26: 100 mg via ORAL
  Filled 2022-10-26: qty 1

## 2022-10-26 MED ORDER — METHYLPREDNISOLONE SODIUM SUCC 125 MG IJ SOLR
125.0000 mg | Freq: Once | INTRAMUSCULAR | Status: AC
  Administered 2022-10-26: 125 mg via INTRAVENOUS
  Filled 2022-10-26: qty 2

## 2022-10-26 NOTE — ED Provider Notes (Signed)
  Physical Exam  BP 137/67 Comment: after ambulating  Pulse 80 Comment: after ambulating  Temp 99.2 F (37.3 C) (Oral)   Resp (!) 29 Comment: after ambulating  Ht 5\' 10"  (1.778 m)   Wt 102.1 kg   SpO2 94% Comment: after ambulating  BMI 32.28 kg/m   Physical Exam  Procedures  Procedures  ED Course / MDM   Clinical Course as of 10/26/22 1551  Sat Oct 26, 2022  1506 O2 saturation 86% on room air when I went to reassess the patient.  He was awake during this.  When he began speaking and taking deep breaths his O2 saturation did improve above 90%.  However, given his age, and concern for persistent pneumonia, I do think hospitalization is reasonable at this point, to monitor his oxygen level overnight and given IV antibiotics. [MT]    Clinical Course User Index [MT] Terald Sleeper, MD   Medical Decision Making Care assumed at 3 PM.  Patient has been having persistent shortness of breath and cough despite being on antibiotics.  Patient is not on any cough medicine.  Patient's oxygen is 91 to 92% most of the ED visit.  Apparently when he was ready for discharge, he desatted to 88%.  Patient states that he feels well.  I discussed case with Dr. Radonna Ricker, hospitalist.  He states that there is only 1 documentation of 88% and recommend ambulation.  Patient ambulated and oxygen went up to 92%.  Patient still appears well afterwards.  I discussed with Dr. Radonna Ricker again.  I think we can give a trial of outpatient management again.  Patient was ordered Rocephin and doxycycline.  I think steroids may be helpful In the situation so I ordered Solu-Medrol.  His white blood cell count is normal and I do not think he is septic right now.  Per the hospitalist, the CT finding may be delayed and patient is clinically appearing well, patient can try outpatient management.  Amount and/or Complexity of Data Reviewed Labs: ordered. Decision-making details documented in ED Course. Radiology: ordered and independent  interpretation performed. Decision-making details documented in ED Course.  Risk Prescription drug management. Decision regarding hospitalization.          Charlynne Pander, MD 10/26/22 [phone]

## 2022-10-26 NOTE — ED Notes (Signed)
RN attempted to collect 2nd set of BC, unsuccessful x1.  Per EDP, RT to ambulate pt prior to another attempt at [facility] collection.

## 2022-10-26 NOTE — ED Notes (Signed)
ED Provider at bedside. 

## 2022-10-26 NOTE — ED Provider Notes (Signed)
[place] [facility] AT [facility] Provider Note   CSN: [id number] Arrival date & time: 10/26/22  1307     History  Chief Complaint  Patient presents with   Cough    Clayton Daniels is a 82 y.o. male presenting to Emergency Department with complaint of productive cough, ongoing now for 9 days.  Patient initially went to urgent care on the first day of his cough, on 10/17/22.  He had x-rays performed, per my review of the interpretation, there were no focal infiltrates noted, but he was told he may have a "bronchitis or bronchopneumonia" and started on Augmentin as well as azithromycin.  He has completed all but 1 day of the Augmentin.  He reports he continues to have the same "rattling cough in my chest".  He denies fevers or chills.  He denies pain in his chest.  He denies any prior history of pneumonia, smoking, or pulmonary disease.  He is here with his wife at bedside.  HPI     Home Medications Prior to Admission medications   Medication Sig Start Date End Date Taking? Authorizing Provider  aspirin 81 MG chewable tablet Chew 81 mg by mouth daily.    [provider]  atorvastatin (LIPITOR) 10 MG tablet Take 10 mg by mouth daily.    [provider]  metoprolol succinate (TOPROL-XL) 25 MG 24 hr tablet Take 25 mg by mouth daily.    [provider]  Multiple Vitamin (MULTIVITAMIN WITH MINERALS) TABS tablet Take 1 tablet by mouth daily. Men's 50 plus    [provider]  naproxen sodium (ANAPROX) 220 MG tablet Take 220 mg by mouth 2 (two) times daily as needed (for pain.).    [provider]  TURMERIC CURCUMIN PO Take 1 capsule by mouth 2 (two) times daily.     [provider]  white petrolatum (VASELINE) GEL Apply 1 application topically daily. Applied to area of head once daily    [provider]  Zinc 50 MG CAPS Take 50 mg by mouth daily.    [provider]      Allergies    Amitriptyline  hcl and Diclofenac sodium    Review of Systems   Review of Systems  Physical Exam Updated Vital Signs BP 116/65   Pulse 63   Temp 99.2 F (37.3 C) (Oral)   Resp 16   Ht 5\' 10"  (1.778 m)   Wt 102.1 kg   SpO2 (!) 88%   BMI 32.28 kg/m  Physical Exam Constitutional:      General: He is not in acute distress. HENT:     Head: Normocephalic and atraumatic.  Eyes:     Conjunctiva/sclera: Conjunctivae normal.     Pupils: Pupils are equal, round, and reactive to light.  Cardiovascular:     Rate and Rhythm: Normal rate and regular rhythm.  Pulmonary:     Effort: Pulmonary effort is normal. No respiratory distress.     Comments: Rhonchi in the mid and lower lung fields, most prominently on the right side 91-93% O2 on room air at rest, speaking comfortably in full sentences Abdominal:     General: There is no distension.     Tenderness: There is no abdominal tenderness.  Skin:    General: Skin is warm and dry.  Neurological:     General: No focal deficit present.     Mental Status: He is alert. Mental status is at baseline.  Psychiatric:  Mood and Affect: Mood normal.        Behavior: Behavior normal.     ED Results / Procedures / Treatments   Labs (all labs ordered are listed, but only abnormal results are displayed) Labs Reviewed  BASIC METABOLIC PANEL - Abnormal; Notable for the following components:      Result Value   Glucose, Bld 111 (*)    Calcium 8.7 (*)    All other components within normal limits  CBC WITH DIFFERENTIAL/PLATELET - Abnormal; Notable for the following components:   RBC 3.98 (*)    Hemoglobin 12.6 (*)    HCT 37.9 (*)    All other components within normal limits  SARS CORONAVIRUS 2 BY RT PCR  BRAIN NATRIURETIC PEPTIDE    EKG None  Radiology CT Chest Wo Contrast  Result Date: 10/26/2022 CLINICAL DATA:  Cough, pneumonia, unresponsive to antibiotics EXAM: CT CHEST WITHOUT CONTRAST TECHNIQUE: Multidetector CT imaging of the chest was  performed following the standard protocol without IV contrast. RADIATION DOSE REDUCTION: This exam was performed according to the departmental dose-optimization program which includes automated exposure control, adjustment of the mA and/or kV according to patient size and/or use of iterative reconstruction technique. COMPARISON:  10/17/2022 FINDINGS: Cardiovascular: Unenhanced imaging of the heart is unremarkable, without pericardial effusion. Dual lead cardiac pacer again identified. There is an ascending thoracic aortic aneurysm measuring 4.2 cm. Evaluation of the vascular lumen is limited without IV contrast. Atherosclerosis of the aorta and coronary vasculature. Mediastinum/Nodes: No enlarged mediastinal or axillary lymph nodes. Thyroid gland, trachea, and esophagus demonstrate no significant findings. Lungs/Pleura: There is dense consolidation within the bilateral lower lobes, left greater than right, likely a combination of pneumonia and atelectasis. More patchy ground-glass airspace disease is seen within the periphery of the bilateral upper lobes. No effusion or pneumothorax. The central airways are patent. Upper Abdomen: No acute abnormality. Musculoskeletal: Left shoulder arthroplasty. No acute or destructive bony abnormalities. Reconstructed images demonstrate no additional findings. IMPRESSION: 1. Dense bilateral lower lobe airspace disease, with more patchy ground-glass airspace disease in the upper lobes, consistent with multifocal bilateral pneumonia. There may be some superimposed atelectasis at the lung bases. 2. 4.2 cm ascending thoracic aortic aneurysm. Recommend annual imaging followup by CTA or MRA. This recommendation follows 2010 ACCF/AHA/AATS/ACR/ASA/SCA/SCAI/SIR/STS/SVM Guidelines for the Diagnosis and Management of Patients with Thoracic Aortic Disease. Circulation. 2010; 121: K440-N027. Aortic aneurysm NOS (ICD10-I71.9) 3. Aortic Atherosclerosis (ICD10-I70.0). Coronary artery  atherosclerosis. Electronically Signed   By: Sharlet Salina M.D.   On: 10/26/2022 14:48    Procedures Procedures    Medications Ordered in ED Medications  cefTRIAXone (ROCEPHIN) 2 g in sodium chloride 0.9 % 100 mL IVPB (has no administration in time range)  doxycycline (VIBRA-TABS) tablet 100 mg (has no administration in time range)  albuterol (VENTOLIN HFA) 108 (90 Base) MCG/ACT inhaler 2 puff (2 puffs Inhalation Given 10/26/22 1433)    ED Course/ Medical Decision Making/ A&P Clinical Course as of 10/26/22 1509  Sat Oct 26, 2022  1506 O2 saturation 86% on room air when I went to reassess the patient.  He was awake during this.  When he began speaking and taking deep breaths his O2 saturation did improve above 90%.  However, given his age, and concern for persistent pneumonia, I do think hospitalization is reasonable at this point, to monitor his oxygen level overnight and given IV antibiotics. [MT]    Clinical Course User Index [MT] Cereniti Curb, Kermit Balo, MD  Medical Decision Making Amount and/or Complexity of Data Reviewed Labs: ordered. Radiology: ordered.  Risk Prescription drug management. Decision regarding hospitalization.   This patient presents to the ED with concern for productive cough, fatigue for 9 days. This involves an extensive number of treatment options, and is a complaint that carries with it a high risk of complications and morbidity.  The differential diagnosis includes persistent pneumonia versus viral URI versus  Additional history obtained from patient's wife  External records from outside source obtained and reviewed including UC evaluation & xrays, covid/flu testing negative at [facility] on 10/17/22  I ordered and personally interpreted labs.  The pertinent results include: No emergent findings  I ordered imaging studies including CT of the chest I independently visualized and interpreted imaging which showed persistent multifocal  pneumonia; incidental ascending aortic aneurysm noted I agree with the radiologist interpretation  The patient was maintained on a cardiac monitor.  I personally viewed and interpreted the cardiac monitored which showed an underlying rhythm of: Sinus rhythm   I ordered medication including IV Rocephin and doxycycline for community pneumonia  I have reviewed the patients home medicines and have made adjustments as needed  Test Considered: Low suspicion for acute pulmonary embolism.  After the interventions noted above, I reevaluated the patient and found that they have: stayed the same  Dispostion:  After consideration of the diagnostic results and the patients response to treatment, I feel that the patent would benefit from hospitalization for pneumonia         Final Clinical Impression(s) / ED Diagnoses Final diagnoses:  Pneumonia of both lower lobes due to infectious organism    Rx / DC Orders ED Discharge Orders     None         Terald Sleeper, MD 10/26/22 [phone]

## 2022-10-26 NOTE — ED Notes (Signed)
   10/26/22 1541  Resting  Supplemental oxygen during test? No  Resting Heart Rate 77  Resting Sp02 92  Lap 1 (250 feet)  HR 93  02 Sat 90   Ambulated in hallway, HR max 92, SpO2 90% at lowesst, quick pace, denies any increased WOB or SOB. SpO2 back in room after short recovery 92%.

## 2022-10-26 NOTE — ED Triage Notes (Signed)
Pt was seen at [facility] and dx w/ bronchitis about 9 days ago; given 2 abx rxs and is almost finished with those; c/o continued cough and "rattling in my chest"

## 2022-10-26 NOTE — ED Notes (Signed)
D/c paperwork reviewed with pt, including prescriptions and follow up care.  All questions and/or concerns addressed at time of d/c.  No further needs expressed. . Pt verbalized understanding, Ambulatory with family to ED exit, NAD.   

## 2022-10-26 NOTE — Discharge Instructions (Signed)
As we discussed, you may have a combination of factors that is causing your shortness of breath.  Your CT scan showed persistent pneumonia and I suspect you may have some bronchitis.  I have switched her antibiotics to Keflex and doxycycline for a week.  I have also prescribed Medrol Dosepak which is a steroid.  You can also use albuterol every 4 hours as needed  I have also prescribed Tessalon Perles for cough  Follow-up with your doctor next week  Return to ER if you have worse shortness of breath or fever or trouble breathing

## 2022-10-26 NOTE — ED Notes (Signed)
Pt transported to imaging.

## 2022-10-27 LAB — CULTURE, BLOOD (ROUTINE X 2)

## 2022-10-28 LAB — CULTURE, BLOOD (ROUTINE X 2): Special Requests: ADEQUATE

## 2022-10-29 LAB — CULTURE, BLOOD (ROUTINE X 2): Culture: NO GROWTH
